# Patient Record
Sex: Male | Born: 1950 | Race: White | Hispanic: No | Marital: Married
Health system: Midwestern US, Academic
[De-identification: ages and names within clinical notes are randomized; demographics above are authoritative.]

## PROBLEM LIST (undated history)

## (undated) DIAGNOSIS — L97411 Non-pressure chronic ulcer of right heel and midfoot limited to breakdown of skin: ICD-10-CM

---

## 2017-05-07 ENCOUNTER — Encounter: Admit: 2017-05-07 | Discharge: 2017-05-07 | Payer: MEDICARE

## 2017-05-07 ENCOUNTER — Emergency Department: Admit: 2017-05-07 | Discharge: 2017-05-07 | Disposition: A | Discharge status: Home / Self Care | Payer: MEDICARE

## 2017-05-07 DIAGNOSIS — I10 Essential (primary) hypertension: Principal | ICD-10-CM

## 2017-05-07 DIAGNOSIS — R109 Unspecified abdominal pain: ICD-10-CM

## 2017-05-07 DIAGNOSIS — R110 Nausea: ICD-10-CM

## 2017-05-07 DIAGNOSIS — F17200 Nicotine dependence, unspecified, uncomplicated: ICD-10-CM

## 2017-05-07 DIAGNOSIS — E785 Hyperlipidemia, unspecified: ICD-10-CM

## 2017-05-07 DIAGNOSIS — D649 Anemia, unspecified: ICD-10-CM

## 2017-05-07 DIAGNOSIS — K921 Melena: ICD-10-CM

## 2017-05-07 DIAGNOSIS — R0600 Dyspnea, unspecified: ICD-10-CM

## 2017-05-07 DIAGNOSIS — E1165 Type 2 diabetes mellitus with hyperglycemia: Principal | ICD-10-CM

## 2017-05-07 DIAGNOSIS — K644 Residual hemorrhoidal skin tags: ICD-10-CM

## 2017-05-07 DIAGNOSIS — K859 Acute pancreatitis without necrosis or infection, unspecified: ICD-10-CM

## 2017-05-07 DIAGNOSIS — E119 Type 2 diabetes mellitus without complications: ICD-10-CM

## 2017-05-07 LAB — CBC AND DIFF
Lab: 0 % — ABNORMAL LOW (ref >60)
Lab: 0.4 10*3/uL (ref 0–0.80)
Lab: 0.8 10*3/uL — ABNORMAL LOW (ref 1.0–4.8)
Lab: 1 % (ref 0–5)
Lab: 113 10*3/uL — ABNORMAL LOW (ref 150–400)
Lab: 13 % — ABNORMAL LOW (ref 11–15)
Lab: 16 % — ABNORMAL LOW (ref 24–44)
Lab: 26 % — ABNORMAL LOW (ref 40–50)
Lab: 3.5 K/UL — ABNORMAL LOW (ref >60)
Lab: 30 pg — ABNORMAL LOW (ref 26–34)
Lab: 34 g/dL (ref 32.0–36.0)
Lab: 4.7 10*3/uL (ref 4.5–11.0)
Lab: 75 % (ref 41–77)
Lab: 8 % (ref 4–12)
Lab: 87 FL — ABNORMAL HIGH (ref 80–100)
Lab: 9.1 g/dL — ABNORMAL LOW (ref 13.5–16.5)
Lab: 9.4 FL (ref 7–11)

## 2017-05-07 LAB — POC BLOOD GAS VEN: Lab: 7.3 (ref 7.30–7.40)

## 2017-05-07 LAB — POC GLUCOSE
Lab: 389 mg/dL — ABNORMAL HIGH (ref 70–100)
Lab: 251 mg/dL — ABNORMAL HIGH (ref 70–100)

## 2017-05-07 LAB — BETA HYDROXYBUTYRATE (KETONES): Lab: 0.1 MMOL/L — ABNORMAL LOW (ref <0.3)

## 2017-05-07 LAB — COMPREHENSIVE METABOLIC PANEL: Lab: 127 MMOL/L — ABNORMAL LOW (ref 137–147)

## 2017-05-07 MED ORDER — SODIUM CHLORIDE 0.9 % IV SOLP
1000 mL | INTRAVENOUS | 0 refills | Status: CP
Start: 2017-05-07 — End: ?
  Administered 2017-05-07: 08:00:00 1000 mL via INTRAVENOUS

## 2017-05-07 NOTE — ED Notes
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

## 2017-06-07 ENCOUNTER — Ambulatory Visit: Admit: 2017-06-07 | Discharge: 2017-06-07 | Payer: MEDICARE

## 2017-06-07 ENCOUNTER — Encounter: Admit: 2017-06-07 | Discharge: 2017-06-07 | Payer: MEDICARE

## 2017-06-07 DIAGNOSIS — K861 Other chronic pancreatitis: Principal | ICD-10-CM

## 2017-06-07 DIAGNOSIS — R0600 Dyspnea, unspecified: ICD-10-CM

## 2017-06-07 DIAGNOSIS — D379 Neoplasm of uncertain behavior of digestive organ, unspecified: ICD-10-CM

## 2017-06-07 DIAGNOSIS — K859 Acute pancreatitis without necrosis or infection, unspecified: ICD-10-CM

## 2017-06-07 DIAGNOSIS — K863 Pseudocyst of pancreas: ICD-10-CM

## 2017-06-07 DIAGNOSIS — E785 Hyperlipidemia, unspecified: ICD-10-CM

## 2017-06-07 DIAGNOSIS — E119 Type 2 diabetes mellitus without complications: ICD-10-CM

## 2017-06-07 DIAGNOSIS — F17200 Nicotine dependence, unspecified, uncomplicated: ICD-10-CM

## 2017-06-07 DIAGNOSIS — I10 Essential (primary) hypertension: Principal | ICD-10-CM

## 2017-06-07 LAB — CBC AND DIFF
Lab: 0 10*3/uL (ref 0–0.20)
Lab: 1 % — ABNORMAL LOW (ref 0–2)
Lab: 19 % — ABNORMAL LOW (ref 24–44)
Lab: 2.5 M/UL — ABNORMAL LOW (ref 4.4–5.5)
Lab: 2.9 10*3/uL — ABNORMAL LOW (ref 4.5–11.0)

## 2017-06-07 LAB — COMPREHENSIVE METABOLIC PANEL
Lab: 0.2 mg/dL — ABNORMAL LOW (ref 0.3–1.2)
Lab: 1.9 mg/dL — ABNORMAL HIGH (ref 0.4–1.24)
Lab: 131 MMOL/L — ABNORMAL LOW (ref 137–147)
Lab: 16 U/L (ref 7–40)
Lab: 17 U/L (ref 7–56)
Lab: 35 mL/min — ABNORMAL LOW (ref >60)
Lab: 370 mg/dL — ABNORMAL HIGH (ref 70–100)
Lab: 4.2 MMOL/L — ABNORMAL LOW (ref 3.5–5.1)
Lab: 42 mL/min — ABNORMAL LOW (ref >60)
Lab: 5 10*3/uL — ABNORMAL LOW (ref 3–12)
Lab: 5.6 g/dL — ABNORMAL LOW (ref 6.0–8.0)
Lab: 50 U/L (ref 25–110)
Lab: 8.2 mg/dL — ABNORMAL LOW (ref 8.5–10.6)

## 2017-06-07 LAB — VITAMIN B12: Lab: 545 pg/mL (ref 180–914)

## 2017-06-07 LAB — CA19.9: Lab: 74 U/mL — ABNORMAL HIGH (ref <35)

## 2017-06-07 MED ORDER — PEG-ELECTROLYTE SOLN 420 GRAM PO SOLR
0 refills | Status: SS
Start: 2017-06-07 — End: 2017-06-25

## 2017-06-07 MED ORDER — SODIUM CHLORIDE 0.9 % IV SOLP: INTRAVENOUS | 0 refills | Status: CN

## 2017-06-20 ENCOUNTER — Encounter: Admit: 2017-06-20 | Discharge: 2017-06-20 | Payer: MEDICARE

## 2017-06-20 DIAGNOSIS — K861 Other chronic pancreatitis: Principal | ICD-10-CM

## 2017-06-20 DIAGNOSIS — R945 Abnormal results of liver function studies: ICD-10-CM

## 2017-06-21 ENCOUNTER — Encounter: Admit: 2017-06-21 | Discharge: 2017-06-21 | Payer: MEDICARE

## 2017-06-22 ENCOUNTER — Ambulatory Visit: Admit: 2017-06-22 | Discharge: 2017-06-22 | Payer: MEDICARE

## 2017-06-22 ENCOUNTER — Encounter: Admit: 2017-06-22 | Discharge: 2017-06-22 | Payer: MEDICARE

## 2017-06-22 DIAGNOSIS — R945 Abnormal results of liver function studies: ICD-10-CM

## 2017-06-22 DIAGNOSIS — K861 Other chronic pancreatitis: Principal | ICD-10-CM

## 2017-06-22 LAB — COMPREHENSIVE METABOLIC PANEL
Lab: 0.2 mg/dL — ABNORMAL LOW (ref 0.3–1.2)
Lab: 127 MMOL/L — ABNORMAL LOW (ref 137–147)
Lab: 15 U/L (ref 7–40)
Lab: 17 U/L (ref 7–56)
Lab: 2.1 mg/dL — ABNORMAL HIGH (ref 0.4–1.24)
Lab: 20 MMOL/L — ABNORMAL LOW (ref 21–30)
Lab: 3.4 g/dL — ABNORMAL LOW (ref 3.5–5.0)
Lab: 3.6 MMOL/L — ABNORMAL LOW (ref 3.5–5.1)
Lab: 31 mL/min — ABNORMAL LOW (ref >60)
Lab: 37 mL/min — ABNORMAL LOW (ref >60)
Lab: 6 g/dL (ref 6.0–8.0)
Lab: 63 mg/dL — ABNORMAL HIGH (ref 7–25)
Lab: 668 mg/dL — ABNORMAL HIGH (ref 70–100)
Lab: 8 (ref 3–12)
Lab: 8 mg/dL — ABNORMAL LOW (ref 8.5–10.6)
Lab: 80 U/L (ref 25–110)

## 2017-06-22 LAB — CBC
Lab: 2.8 M/UL — ABNORMAL LOW (ref 4.4–5.5)
Lab: 3 10*3/uL — ABNORMAL LOW (ref 4.5–11.0)

## 2017-06-22 LAB — PROTIME INR (PT): Lab: 1 MMOL/L (ref 0.8–1.2)

## 2017-06-25 ENCOUNTER — Ambulatory Visit: Admit: 2017-06-25 | Discharge: 2017-06-25 | Payer: MEDICARE

## 2017-06-25 ENCOUNTER — Encounter: Admit: 2017-06-25 | Discharge: 2017-06-25 | Payer: MEDICARE

## 2017-06-25 ENCOUNTER — Encounter: Admit: 2017-06-25 | Discharge: 2017-06-26 | Payer: MEDICARE

## 2017-06-25 DIAGNOSIS — R978 Other abnormal tumor markers: ICD-10-CM

## 2017-06-25 DIAGNOSIS — I10 Essential (primary) hypertension: ICD-10-CM

## 2017-06-25 DIAGNOSIS — K8689 Other specified diseases of pancreas: ICD-10-CM

## 2017-06-25 DIAGNOSIS — Z1211 Encounter for screening for malignant neoplasm of colon: Principal | ICD-10-CM

## 2017-06-25 DIAGNOSIS — E119 Type 2 diabetes mellitus without complications: ICD-10-CM

## 2017-06-25 DIAGNOSIS — K859 Acute pancreatitis without necrosis or infection, unspecified: Secondary | ICD-10-CM

## 2017-06-25 DIAGNOSIS — R933 Abnormal findings on diagnostic imaging of other parts of digestive tract: ICD-10-CM

## 2017-06-25 DIAGNOSIS — K644 Residual hemorrhoidal skin tags: ICD-10-CM

## 2017-06-25 DIAGNOSIS — K3189 Other diseases of stomach and duodenum: ICD-10-CM

## 2017-06-25 DIAGNOSIS — F17200 Nicotine dependence, unspecified, uncomplicated: ICD-10-CM

## 2017-06-25 DIAGNOSIS — K648 Other hemorrhoids: ICD-10-CM

## 2017-06-25 DIAGNOSIS — Z87891 Personal history of nicotine dependence: ICD-10-CM

## 2017-06-25 DIAGNOSIS — K861 Other chronic pancreatitis: ICD-10-CM

## 2017-06-25 DIAGNOSIS — R935 Abnormal findings on diagnostic imaging of other abdominal regions, including retroperitoneum: ICD-10-CM

## 2017-06-25 DIAGNOSIS — K269 Duodenal ulcer, unspecified as acute or chronic, without hemorrhage or perforation: ICD-10-CM

## 2017-06-25 DIAGNOSIS — R0600 Dyspnea, unspecified: ICD-10-CM

## 2017-06-25 DIAGNOSIS — E785 Hyperlipidemia, unspecified: ICD-10-CM

## 2017-06-25 LAB — POC BLOOD GAS VEN
Lab: 11 MMOL/L
Lab: 16 MMOL/L
Lab: 21 mmHg — ABNORMAL LOW (ref 33–48)
Lab: 29 % — ABNORMAL LOW (ref 55–71)
Lab: 36 mmHg (ref 36–50)
Lab: 7.2 — ABNORMAL LOW (ref 7.30–7.40)

## 2017-06-25 LAB — POC GLUCOSE
Lab: 228 mg/dL — ABNORMAL HIGH (ref 70–100)
Lab: 286 mg/dL — ABNORMAL HIGH (ref 70–100)
Lab: 274 mg/dL — ABNORMAL HIGH (ref 70–100)

## 2017-06-25 LAB — POC IONIZED CALCIUM: Lab: 1.2 MMOL/L (ref 1.0–1.3)

## 2017-06-25 LAB — POC SODIUM: Lab: 141 MMOL/L (ref 137–147)

## 2017-06-25 LAB — POC HEMATOCRIT
Lab: 24 % — ABNORMAL LOW (ref 40–50)
Lab: 8.2 g/dL — ABNORMAL LOW (ref 13.5–16.5)

## 2017-06-25 LAB — POC POTASSIUM: Lab: 3.5 MMOL/L (ref 3.5–5.1)

## 2017-06-25 MED ORDER — IOPAMIDOL 61 % IV SOLN
0 refills | Status: DC
Start: 2017-06-25 — End: 2017-06-25
  Administered 2017-06-25: 17:00:00 20 mL via INTRAMUSCULAR

## 2017-06-25 MED ORDER — ROCURONIUM 10 MG/ML IV SOLN
0 refills | Status: DC
Start: 2017-06-25 — End: 2017-06-25
  Administered 2017-06-25: 15:00:00 50 mg via INTRAVENOUS

## 2017-06-25 MED ORDER — EPHEDRINE SULFATE 50 MG/5ML SYR (10 MG/ML) (AN)(OSM)
0 refills | Status: DC
Start: 2017-06-25 — End: 2017-06-25
  Administered 2017-06-25 (×2): 10 mg via INTRAVENOUS

## 2017-06-25 MED ORDER — CEFTRIAXONE 1 GRAM IJ SOLR
0 refills | Status: DC
Start: 2017-06-25 — End: 2017-06-25
  Administered 2017-06-25: 15:00:00 1 g via INTRAVENOUS

## 2017-06-25 MED ORDER — INSULIN ASPART 100 UNIT/ML SC FLEXPEN
4 [IU] | Freq: Once | SUBCUTANEOUS | 0 refills | Status: CP
Start: 2017-06-25 — End: ?
  Administered 2017-06-25: 18:00:00 4 [IU] via SUBCUTANEOUS

## 2017-06-25 MED ORDER — FENTANYL CITRATE (PF) 50 MCG/ML IJ SOLN
0 refills | Status: DC
Start: 2017-06-25 — End: 2017-06-25
  Administered 2017-06-25 (×2): 50 ug via INTRAVENOUS
  Administered 2017-06-25: 14:00:00 100 ug via INTRAVENOUS

## 2017-06-25 MED ORDER — LIDOCAINE (PF) 200 MG/10 ML (2 %) IJ SYRG
0 refills | Status: DC
Start: 2017-06-25 — End: 2017-06-25
  Administered 2017-06-25: 14:00:00 100 mg via INTRAVENOUS

## 2017-06-25 MED ORDER — DEXAMETHASONE SODIUM PHOSPHATE 4 MG/ML IJ SOLN
INTRAVENOUS | 0 refills | Status: DC
Start: 2017-06-25 — End: 2017-06-25

## 2017-06-25 MED ORDER — DEXTRAN 70-HYPROMELLOSE (PF) 0.1-0.3 % OP DPET
0 refills | Status: DC
Start: 2017-06-25 — End: 2017-06-25
  Administered 2017-06-25: 14:00:00 2 [drp] via OPHTHALMIC

## 2017-06-25 MED ORDER — ONDANSETRON HCL (PF) 4 MG/2 ML IJ SOLN
INTRAVENOUS | 0 refills | Status: DC
Start: 2017-06-25 — End: 2017-06-25
  Administered 2017-06-25: 15:00:00 4 mg via INTRAVENOUS

## 2017-06-25 MED ORDER — KETAMINE 10 MG/ML IJ SOLN
0 refills | Status: DC
Start: 2017-06-25 — End: 2017-06-25
  Administered 2017-06-25: 15:00:00 30 mg via INTRAVENOUS

## 2017-06-25 MED ORDER — GLYCOPYRROLATE 0.2 MG/ML IJ SOLN
0 refills | Status: DC
Start: 2017-06-25 — End: 2017-06-25
  Administered 2017-06-25: 17:00:00 0.4 mg via INTRAVENOUS

## 2017-06-25 MED ORDER — SUCCINYLCHOLINE CHLORIDE 20 MG/ML IJ SOLN
INTRAVENOUS | 0 refills | Status: DC
Start: 2017-06-25 — End: 2017-06-25
  Administered 2017-06-25: 14:00:00 100 mg via INTRAVENOUS

## 2017-06-25 MED ORDER — PROPOFOL INJ 10 MG/ML IV VIAL
0 refills | Status: DC
Start: 2017-06-25 — End: 2017-06-25
  Administered 2017-06-25: 14:00:00 120 mg via INTRAVENOUS
  Administered 2017-06-25: 16:00:00 80 mg via INTRAVENOUS

## 2017-06-25 MED ORDER — NEOSTIGMINE METHYLSULFATE 1 MG/ML IJ SOLN
0 refills | Status: DC
Start: 2017-06-25 — End: 2017-06-25
  Administered 2017-06-25: 17:00:00 3 mg via INTRAVENOUS

## 2017-06-25 MED ORDER — SODIUM CHLORIDE 0.9 % IR SOLN
0 refills | Status: DC
Start: 2017-06-25 — End: 2017-06-25
  Administered 2017-06-25: 17:00:00 20 mL

## 2017-06-25 MED ORDER — LACTATED RINGERS IV SOLP
1000 mL | Freq: Once | INTRAVENOUS | 0 refills | Status: CP
Start: 2017-06-25 — End: ?
  Administered 2017-06-25: 13:00:00 1000 mL via INTRAVENOUS

## 2017-06-25 MED ORDER — SODIUM CHLORIDE 0.9 % IV SOLP
0 refills | Status: DC
Start: 2017-06-25 — End: 2017-06-25
  Administered 2017-06-25: 14:00:00 via INTRAVENOUS

## 2017-06-25 MED ORDER — PHENYLEPHRINE IN 0.9% NACL(PF) 1 MG/10 ML (100 MCG/ML) IV SYRG
INTRAVENOUS | 0 refills | Status: DC
Start: 2017-06-25 — End: 2017-06-25
  Administered 2017-06-25: 15:00:00 100 ug via INTRAVENOUS

## 2017-06-26 ENCOUNTER — Ambulatory Visit: Admit: 2017-06-26 | Discharge: 2017-06-26 | Payer: MEDICARE

## 2017-06-26 ENCOUNTER — Encounter: Admit: 2017-06-26 | Discharge: 2017-06-26 | Payer: MEDICARE

## 2017-06-26 DIAGNOSIS — K831 Obstruction of bile duct: Principal | ICD-10-CM

## 2017-06-26 MED ORDER — SODIUM CHLORIDE 0.9 % IV SOLP: INTRAVENOUS | 0 refills | Status: CN

## 2017-06-27 ENCOUNTER — Encounter: Admit: 2017-06-27 | Discharge: 2017-06-27 | Payer: MEDICARE

## 2017-06-27 DIAGNOSIS — I10 Essential (primary) hypertension: Principal | ICD-10-CM

## 2017-06-27 DIAGNOSIS — K859 Acute pancreatitis without necrosis or infection, unspecified: ICD-10-CM

## 2017-06-27 DIAGNOSIS — F17200 Nicotine dependence, unspecified, uncomplicated: ICD-10-CM

## 2017-06-27 DIAGNOSIS — E785 Hyperlipidemia, unspecified: ICD-10-CM

## 2017-06-27 DIAGNOSIS — E119 Type 2 diabetes mellitus without complications: ICD-10-CM

## 2017-06-27 DIAGNOSIS — R0600 Dyspnea, unspecified: ICD-10-CM

## 2017-06-28 ENCOUNTER — Encounter: Admit: 2017-06-28 | Discharge: 2017-06-28 | Payer: MEDICARE

## 2017-07-06 ENCOUNTER — Encounter: Admit: 2017-07-06 | Discharge: 2017-07-06 | Payer: MEDICARE

## 2017-08-09 ENCOUNTER — Encounter: Admit: 2017-08-09 | Discharge: 2017-08-09 | Payer: MEDICARE

## 2017-08-09 DIAGNOSIS — R0600 Dyspnea, unspecified: ICD-10-CM

## 2017-08-09 DIAGNOSIS — E119 Type 2 diabetes mellitus without complications: ICD-10-CM

## 2017-08-09 DIAGNOSIS — I10 Essential (primary) hypertension: Principal | ICD-10-CM

## 2017-08-09 DIAGNOSIS — F17200 Nicotine dependence, unspecified, uncomplicated: ICD-10-CM

## 2017-08-09 DIAGNOSIS — K859 Acute pancreatitis without necrosis or infection, unspecified: ICD-10-CM

## 2017-08-09 DIAGNOSIS — E785 Hyperlipidemia, unspecified: ICD-10-CM

## 2017-09-16 DIAGNOSIS — R739 Hyperglycemia, unspecified: ICD-10-CM

## 2017-09-16 MED ORDER — INSULIN REGULAR HUMAN(#) 1 UNIT/ML IJ SYRINGE
6 [IU] | Freq: Once | INTRAVENOUS | 0 refills | Status: CP
Start: 2017-09-16 — End: ?
  Administered 2017-09-17: 03:00:00 6 [IU] via INTRAVENOUS

## 2017-09-16 MED ORDER — VANCOMYCIN 1,250 MG IVPB
15 mg/kg | Freq: Once | INTRAVENOUS | 0 refills | Status: CP
Start: 2017-09-16 — End: ?
  Administered 2017-09-17 (×2): 1250 mg via INTRAVENOUS

## 2017-09-16 MED ORDER — CEFTRIAXONE INJ 1GM IVP
1 g | Freq: Once | INTRAVENOUS | 0 refills | Status: DC
Start: 2017-09-16 — End: 2017-09-17

## 2017-09-16 MED ORDER — INSULIN ASPART 100 UNIT/ML SC FLEXPEN
10 [IU] | Freq: Three times a day (TID) | SUBCUTANEOUS | 0 refills | Status: DC
Start: 2017-09-16 — End: 2017-09-17

## 2017-09-16 MED ORDER — PIPERACILLIN/TAZOBACTAM 3.375 G/NS IVPB (MB+)
3.375 g | Freq: Once | INTRAVENOUS | 0 refills | Status: CP
Start: 2017-09-16 — End: ?
  Administered 2017-09-17 (×2): 3.375 g via INTRAVENOUS

## 2017-09-16 MED ORDER — INSULIN GLARGINE 100 UNIT/ML (3 ML) SC INJ PEN
15 [IU] | Freq: Every evening | SUBCUTANEOUS | 0 refills | Status: DC
Start: 2017-09-16 — End: 2017-09-18
  Administered 2017-09-17: 06:00:00 15 [IU] via SUBCUTANEOUS

## 2017-09-16 MED ORDER — LACTATED RINGERS IV SOLP
1000 mL | INTRAVENOUS | 0 refills | Status: CP
Start: 2017-09-16 — End: ?
  Administered 2017-09-17: 03:00:00 1000 mL via INTRAVENOUS

## 2017-09-16 MED ORDER — NICARDIPINE IN NACL (ISO-OS) 20 MG/200 ML IV PGBK
5-15 mg/h | INTRAVENOUS | 0 refills | Status: DC
Start: 2017-09-16 — End: 2017-09-17
  Administered 2017-09-17: 05:00:00 5 mg/h via INTRAVENOUS

## 2017-09-17 ENCOUNTER — Inpatient Hospital Stay: Admit: 2017-09-17 | Discharge: 2017-09-20 | Disposition: A | Discharge status: Home Health | Payer: MEDICARE

## 2017-09-17 ENCOUNTER — Emergency Department: Admit: 2017-09-16 | Discharge: 2017-09-16 | Payer: MEDICARE

## 2017-09-17 ENCOUNTER — Inpatient Hospital Stay: Admit: 2017-09-17 | Discharge: 2017-09-17 | Payer: MEDICARE

## 2017-09-17 LAB — BETA HYDROXYBUTYRATE (KETONES): Lab: 0.1 MMOL/L (ref <0.3)

## 2017-09-17 LAB — COMPREHENSIVE METABOLIC PANEL
Lab: 0.2 mg/dL — ABNORMAL LOW (ref >60)
Lab: 129 MMOL/L — ABNORMAL LOW (ref 137–147)
Lab: 16 U/L (ref 7–40)
Lab: 19 U/L (ref 7–56)
Lab: 2.7 mg/dL — ABNORMAL HIGH (ref 0.4–1.24)
Lab: 23 MMOL/L (ref 21–30)
Lab: 23 mL/min — ABNORMAL LOW (ref >60)
Lab: 28 mL/min — ABNORMAL LOW (ref >60)
Lab: 52 mg/dL — ABNORMAL HIGH (ref 7–25)
Lab: 6 10*3/uL (ref 3–12)
Lab: 661 mg/dL — ABNORMAL HIGH (ref 70–100)
Lab: 79 U/L — ABNORMAL LOW (ref 25–110)
Lab: 8.3 mg/dL — ABNORMAL LOW (ref 8.5–10.6)

## 2017-09-17 LAB — HEMOGLOBIN A1C: Lab: 9.7 % — ABNORMAL HIGH (ref 4.0–6.0)

## 2017-09-17 LAB — POC GLUCOSE
Lab: 443 mg/dL — ABNORMAL HIGH (ref 70–100)
Lab: 468 mg/dL — ABNORMAL HIGH (ref 70–100)
Lab: 279 mg/dL — ABNORMAL HIGH (ref 70–100)
Lab: 101 mg/dL — ABNORMAL HIGH (ref 70–100)
Lab: 48 mg/dL — CL (ref 70–100)
Lab: 126 mg/dL — ABNORMAL HIGH (ref 70–100)
Lab: 53 mg/dL — ABNORMAL LOW (ref 70–100)
Lab: 73 mg/dL (ref 70–100)
Lab: 138 mg/dL — ABNORMAL HIGH (ref 70–100)

## 2017-09-17 LAB — SED RATE: Lab: 52 mm/h — ABNORMAL HIGH (ref 0–20)

## 2017-09-17 LAB — PHOSPHORUS: Lab: 3.7 mg/dL (ref 2.0–4.5)

## 2017-09-17 LAB — CBC AND DIFF
Lab: 0 10*3/uL (ref 0–0.20)
Lab: 0 10*3/uL (ref 0–0.45)
Lab: 4.3 K/UL — ABNORMAL LOW (ref 4.5–11.0)

## 2017-09-17 LAB — MAGNESIUM: Lab: 2 mg/dL (ref 1.6–2.6)

## 2017-09-17 LAB — TSH WITH FREE T4 REFLEX: Lab: 1.1 uU/mL (ref 0.35–5.00)

## 2017-09-17 LAB — C REACTIVE PROTEIN (CRP): Lab: 3.2 mg/dL — ABNORMAL HIGH (ref <1.0)

## 2017-09-17 MED ORDER — INSULIN ASPART 100 UNIT/ML SC FLEXPEN
15 [IU] | Freq: Once | SUBCUTANEOUS | 0 refills | Status: CP
Start: 2017-09-17 — End: ?
  Administered 2017-09-17: 06:00:00 15 [IU] via SUBCUTANEOUS

## 2017-09-17 MED ORDER — FENTANYL CITRATE (PF) 50 MCG/ML IJ SOLN
25-50 ug | INTRAVENOUS | 0 refills | Status: DC | PRN
Start: 2017-09-17 — End: 2017-09-17
  Administered 2017-09-17: 16:00:00 50 ug via INTRAVENOUS
  Administered 2017-09-17 (×2): 25 ug via INTRAVENOUS

## 2017-09-17 MED ORDER — FENOFIBRATE NANOCRYSTALLIZED 48 MG PO TAB
48 mg | Freq: Every day | ORAL | 0 refills | Status: DC
Start: 2017-09-17 — End: 2017-09-20
  Administered 2017-09-17 – 2017-09-20 (×4): 48 mg via ORAL

## 2017-09-17 MED ORDER — PIPERACILLIN/TAZOBACTAM 3.375 G/NS IVPB (MB+)
3.375 g | INTRAVENOUS | 0 refills | Status: DC
Start: 2017-09-17 — End: 2017-09-17
  Administered 2017-09-17 (×4): 3.375 g via INTRAVENOUS

## 2017-09-17 MED ORDER — PANTOPRAZOLE 40 MG PO TBEC
40 mg | Freq: Two times a day (BID) | ORAL | 0 refills | Status: DC
Start: 2017-09-17 — End: 2017-09-20
  Administered 2017-09-17 – 2017-09-20 (×7): 40 mg via ORAL

## 2017-09-17 MED ORDER — INSULIN ASPART 100 UNIT/ML SC FLEXPEN
0-6 [IU] | Freq: Before meals | SUBCUTANEOUS | 0 refills | Status: DC
Start: 2017-09-17 — End: 2017-09-20

## 2017-09-17 MED ORDER — AMLODIPINE 10 MG PO TAB
10 mg | Freq: Every day | ORAL | 0 refills | Status: DC
Start: 2017-09-17 — End: 2017-09-20
  Administered 2017-09-17 – 2017-09-20 (×4): 10 mg via ORAL

## 2017-09-17 MED ORDER — ASPIRIN 81 MG PO TBEC
81 mg | Freq: Every day | ORAL | 0 refills | Status: DC
Start: 2017-09-17 — End: 2017-09-20
  Administered 2017-09-17 – 2017-09-20 (×4): 81 mg via ORAL

## 2017-09-17 MED ORDER — LOSARTAN 50 MG PO TAB
100 mg | Freq: Every day | ORAL | 0 refills | Status: DC
Start: 2017-09-17 — End: 2017-09-18
  Administered 2017-09-17: 14:00:00 100 mg via ORAL

## 2017-09-17 MED ORDER — VANCOMYCIN RANDOM DOSING
1 | INTRAVENOUS | 0 refills | Status: DC
Start: 2017-09-17 — End: 2017-09-18

## 2017-09-17 MED ORDER — VANCOMYCIN 1G/250ML D5W IVPB (VIAL2BAG)
1 g | Freq: Once | INTRAVENOUS | 0 refills | Status: CP
Start: 2017-09-17 — End: ?
  Administered 2017-09-18 (×2): 1000 mg via INTRAVENOUS

## 2017-09-17 MED ORDER — CITALOPRAM 20 MG PO TAB
40 mg | Freq: Every day | ORAL | 0 refills | Status: DC
Start: 2017-09-17 — End: 2017-09-20
  Administered 2017-09-17 – 2017-09-20 (×4): 40 mg via ORAL

## 2017-09-17 MED ORDER — HEPARIN, PORCINE (PF) 5,000 UNIT/0.5 ML IJ SYRG
5000 [IU] | SUBCUTANEOUS | 0 refills | Status: DC
Start: 2017-09-17 — End: 2017-09-20
  Administered 2017-09-17 – 2017-09-20 (×10): 5000 [IU] via SUBCUTANEOUS

## 2017-09-17 MED ORDER — HYDROCODONE-ACETAMINOPHEN 5-325 MG PO TAB
1 | ORAL | 0 refills | Status: DC | PRN
Start: 2017-09-17 — End: 2017-09-19
  Administered 2017-09-17 – 2017-09-19 (×10): 1 via ORAL

## 2017-09-17 MED ORDER — INSULIN ASPART 100 UNIT/ML SC FLEXPEN
6 [IU] | Freq: Three times a day (TID) | SUBCUTANEOUS | 0 refills | Status: DC
Start: 2017-09-17 — End: 2017-09-20

## 2017-09-17 MED ORDER — METOPROLOL SUCCINATE 50 MG PO TB24
100 mg | Freq: Every day | ORAL | 0 refills | Status: DC
Start: 2017-09-17 — End: 2017-09-18
  Administered 2017-09-17 – 2017-09-18 (×2): 100 mg via ORAL

## 2017-09-17 MED ORDER — SENNOSIDES-DOCUSATE SODIUM 8.6-50 MG PO TAB
1 | Freq: Two times a day (BID) | ORAL | 0 refills | Status: DC
Start: 2017-09-17 — End: 2017-09-20
  Administered 2017-09-17 – 2017-09-20 (×7): 1 via ORAL

## 2017-09-17 MED ORDER — INSULIN ASPART 100 UNIT/ML SC FLEXPEN
0-12 [IU] | Freq: Before meals | SUBCUTANEOUS | 0 refills | Status: DC
Start: 2017-09-17 — End: 2017-09-17

## 2017-09-17 MED ORDER — FERROUS SULFATE 325 MG (65 MG IRON) PO TAB
325 mg | Freq: Two times a day (BID) | ORAL | 0 refills | Status: DC
Start: 2017-09-17 — End: 2017-09-20
  Administered 2017-09-17 – 2017-09-20 (×8): 325 mg via ORAL

## 2017-09-17 MED ORDER — SODIUM CHLORIDE 0.9 % IV SOLP
INTRAVENOUS | 0 refills | Status: DC
Start: 2017-09-17 — End: 2017-09-17
  Administered 2017-09-17: 06:00:00 1000.000 mL via INTRAVENOUS

## 2017-09-17 MED ORDER — ROPINIROLE 1 MG PO TAB
4 mg | Freq: Every evening | ORAL | 0 refills | Status: DC
Start: 2017-09-17 — End: 2017-09-20
  Administered 2017-09-17 – 2017-09-20 (×4): 4 mg via ORAL

## 2017-09-17 MED ORDER — VANCOMYCIN PHARMACY TO MANAGE
1 | 0 refills | Status: DC
Start: 2017-09-17 — End: 2017-09-18

## 2017-09-18 LAB — POC GLUCOSE
Lab: 116 mg/dL — ABNORMAL HIGH (ref 70–100)
Lab: 54 mg/dL — ABNORMAL LOW (ref 70–100)
Lab: 71 mg/dL (ref 70–100)
Lab: 148 mg/dL — ABNORMAL HIGH (ref 70–100)
Lab: 149 mg/dL — ABNORMAL HIGH (ref 70–100)

## 2017-09-18 LAB — MAGNESIUM: Lab: 2.1 mg/dL — ABNORMAL HIGH (ref >60)

## 2017-09-18 LAB — PHOSPHORUS: Lab: 4.5 mg/dL — ABNORMAL HIGH (ref 2.0–4.5)

## 2017-09-18 LAB — SED RATE: Lab: 27 mm/h — ABNORMAL HIGH (ref 0–20)

## 2017-09-18 LAB — CBC AND DIFF
Lab: 2.5 M/UL — ABNORMAL LOW (ref 4.4–5.5)
Lab: 2.8 K/UL — ABNORMAL LOW (ref 4.5–11.0)

## 2017-09-18 LAB — VANCOMYCIN TIMED LEVEL: Lab: 9.5 ug/mL

## 2017-09-18 LAB — COMPREHENSIVE METABOLIC PANEL
Lab: 137 MMOL/L — ABNORMAL LOW (ref >60)
Lab: 3.5 MMOL/L — ABNORMAL LOW (ref >60)
Lab: 81 mg/dL (ref 70–100)

## 2017-09-18 LAB — C REACTIVE PROTEIN (CRP): Lab: 2.2 mg/dL — ABNORMAL HIGH (ref <1.0)

## 2017-09-18 MED ORDER — CLINDAMYCIN HCL 150 MG PO CAP
300 mg | ORAL | 0 refills | Status: DC
Start: 2017-09-18 — End: 2017-09-20
  Administered 2017-09-19 – 2017-09-20 (×7): 300 mg via ORAL

## 2017-09-18 MED ORDER — MORPHINE(#) 0.01%/SILVER SULFADIAZINE 1% TOPICAL CRM
TOPICAL | 0 refills | Status: DC
Start: 2017-09-18 — End: 2017-09-20
  Administered 2017-09-19: 01:00:00 via TOPICAL

## 2017-09-18 MED ORDER — INSULIN GLARGINE 100 UNIT/ML (3 ML) SC INJ PEN
10 [IU] | Freq: Every evening | SUBCUTANEOUS | 0 refills | Status: DC
Start: 2017-09-18 — End: 2017-09-19

## 2017-09-18 MED ORDER — CARVEDILOL 12.5 MG PO TAB
12.5 mg | Freq: Two times a day (BID) | ORAL | 0 refills | Status: DC
Start: 2017-09-18 — End: 2017-09-20
  Administered 2017-09-19 – 2017-09-20 (×3): 12.5 mg via ORAL

## 2017-09-19 ENCOUNTER — Inpatient Hospital Stay: Admit: 2017-09-19 | Discharge: 2017-09-19 | Payer: MEDICARE

## 2017-09-19 ENCOUNTER — Encounter: Admit: 2017-09-19 | Discharge: 2017-09-19 | Payer: MEDICARE

## 2017-09-19 LAB — POC GLUCOSE
Lab: 162 mg/dL — ABNORMAL HIGH (ref 70–100)
Lab: 177 mg/dL — ABNORMAL HIGH (ref 70–100)
Lab: 197 mg/dL — ABNORMAL HIGH (ref 70–100)
Lab: 228 mg/dL — ABNORMAL HIGH (ref 70–100)

## 2017-09-19 LAB — MAGNESIUM: Lab: 2.1 mg/dL — ABNORMAL LOW (ref 1.6–2.6)

## 2017-09-19 LAB — CBC AND DIFF: Lab: 2.7 K/UL — ABNORMAL LOW (ref >60)

## 2017-09-19 LAB — PHOSPHORUS: Lab: 4.7 mg/dL — ABNORMAL HIGH (ref 2.0–4.5)

## 2017-09-19 LAB — COMPREHENSIVE METABOLIC PANEL: Lab: 138 MMOL/L — ABNORMAL LOW (ref >60)

## 2017-09-19 MED ORDER — CARVEDILOL 12.5 MG PO TAB
12.5 mg | ORAL_TABLET | Freq: Two times a day (BID) | ORAL | 1 refills | Status: CN
Start: 2017-09-19 — End: ?

## 2017-09-19 MED ORDER — FENTANYL CITRATE (PF) 50 MCG/ML IJ SOLN
25 ug | Freq: Once | INTRAVENOUS | 0 refills | Status: CP
Start: 2017-09-19 — End: ?
  Administered 2017-09-20: 05:00:00 25 ug via INTRAVENOUS

## 2017-09-19 MED ORDER — ACETAMINOPHEN 325 MG PO TAB
650 mg | ORAL | 0 refills | Status: DC
Start: 2017-09-19 — End: 2017-09-20
  Administered 2017-09-19 – 2017-09-20 (×5): 650 mg via ORAL

## 2017-09-19 MED ORDER — OXYCODONE 10 MG PO TAB
10 mg | ORAL | 0 refills | Status: DC | PRN
Start: 2017-09-19 — End: 2017-09-20
  Administered 2017-09-19 – 2017-09-20 (×4): 10 mg via ORAL

## 2017-09-19 MED ORDER — INSULIN GLARGINE 100 UNIT/ML (3 ML) SC INJ PEN
12 [IU] | Freq: Every evening | SUBCUTANEOUS | 0 refills | Status: DC
Start: 2017-09-19 — End: 2017-09-20

## 2017-09-19 MED ORDER — EPOETIN ALFA  5,000 UNIT IJ SOLN
5000 [IU] | Freq: Once | SUBCUTANEOUS | 0 refills | Status: CP
Start: 2017-09-19 — End: ?
  Administered 2017-09-19 (×2): 5000 [IU] via SUBCUTANEOUS

## 2017-09-20 ENCOUNTER — Encounter: Admit: 2017-09-20 | Discharge: 2017-09-20 | Payer: MEDICARE

## 2017-09-20 DIAGNOSIS — Z87891 Personal history of nicotine dependence: ICD-10-CM

## 2017-09-20 DIAGNOSIS — D61818 Other pancytopenia: ICD-10-CM

## 2017-09-20 DIAGNOSIS — N183 Chronic kidney disease, stage 3 (moderate): ICD-10-CM

## 2017-09-20 DIAGNOSIS — Z8711 Personal history of peptic ulcer disease: ICD-10-CM

## 2017-09-20 DIAGNOSIS — I160 Hypertensive urgency: ICD-10-CM

## 2017-09-20 DIAGNOSIS — E1122 Type 2 diabetes mellitus with diabetic chronic kidney disease: ICD-10-CM

## 2017-09-20 DIAGNOSIS — E785 Hyperlipidemia, unspecified: ICD-10-CM

## 2017-09-20 DIAGNOSIS — E1151 Type 2 diabetes mellitus with diabetic peripheral angiopathy without gangrene: ICD-10-CM

## 2017-09-20 DIAGNOSIS — L97429 Non-pressure chronic ulcer of left heel and midfoot with unspecified severity: ICD-10-CM

## 2017-09-20 DIAGNOSIS — E11621 Type 2 diabetes mellitus with foot ulcer: Principal | ICD-10-CM

## 2017-09-20 DIAGNOSIS — I129 Hypertensive chronic kidney disease with stage 1 through stage 4 chronic kidney disease, or unspecified chronic kidney disease: ICD-10-CM

## 2017-09-20 DIAGNOSIS — R700 Elevated erythrocyte sedimentation rate: ICD-10-CM

## 2017-09-20 DIAGNOSIS — E1100 Type 2 diabetes mellitus with hyperosmolarity without nonketotic hyperglycemic-hyperosmolar coma (NKHHC): ICD-10-CM

## 2017-09-20 DIAGNOSIS — Z794 Long term (current) use of insulin: ICD-10-CM

## 2017-09-20 DIAGNOSIS — I2510 Atherosclerotic heart disease of native coronary artery without angina pectoris: ICD-10-CM

## 2017-09-20 DIAGNOSIS — E871 Hypo-osmolality and hyponatremia: ICD-10-CM

## 2017-09-20 DIAGNOSIS — E1142 Type 2 diabetes mellitus with diabetic polyneuropathy: ICD-10-CM

## 2017-09-20 DIAGNOSIS — E11649 Type 2 diabetes mellitus with hypoglycemia without coma: ICD-10-CM

## 2017-09-20 LAB — POC GLUCOSE
Lab: 180 mg/dL — ABNORMAL HIGH (ref 70–100)
Lab: 310 mg/dL — ABNORMAL HIGH (ref 70–100)
Lab: 105 mg/dL — ABNORMAL HIGH (ref 70–100)

## 2017-09-20 LAB — MAGNESIUM: Lab: 2.1 mg/dL — ABNORMAL HIGH (ref >60)

## 2017-09-20 LAB — COMPREHENSIVE METABOLIC PANEL: Lab: 138 MMOL/L — ABNORMAL LOW (ref >60)

## 2017-09-20 LAB — PHOSPHORUS: Lab: 5.1 mg/dL — ABNORMAL HIGH (ref >60)

## 2017-09-20 LAB — CBC AND DIFF: Lab: 2.4 K/UL — ABNORMAL LOW (ref 4.5–11.0)

## 2017-09-20 MED ORDER — CLINDAMYCIN HCL 300 MG PO CAP
300 mg | ORAL_CAPSULE | ORAL | 0 refills | Status: AC
Start: 2017-09-20 — End: ?

## 2017-09-20 MED ORDER — LOSARTAN 100 MG PO TAB
100 mg | ORAL_TABLET | Freq: Every morning | ORAL | 3 refills | 30.00000 days | Status: AC
Start: 2017-09-20 — End: 2017-09-20

## 2017-09-20 MED ORDER — OXYCODONE 10 MG PO TAB
10 mg | ORAL_TABLET | ORAL | 0 refills | 6.00000 days | Status: AC | PRN
Start: 2017-09-20 — End: 2017-09-26

## 2017-09-20 MED ORDER — LOSARTAN 100 MG PO TAB
100 mg | Freq: Every morning | ORAL | 0 refills | 30.00000 days | Status: AC
Start: 2017-09-20 — End: 2017-09-26

## 2017-09-20 MED ORDER — MORPHINE(#) 0.01%/SILVER SULFADIAZINE 1% TOPICAL CRM
400 g | TOPICAL | 0 refills | 10.00000 days | Status: AC
Start: 2017-09-20 — End: 2017-11-28

## 2017-09-20 MED ORDER — CARVEDILOL 25 MG PO TAB
25 mg | ORAL_TABLET | Freq: Two times a day (BID) | ORAL | 1 refills | 90.00000 days | Status: AC
Start: 2017-09-20 — End: 2018-01-30

## 2017-09-20 MED ORDER — CARVEDILOL 12.5 MG PO TAB
25 mg | Freq: Two times a day (BID) | ORAL | 0 refills | Status: DC
Start: 2017-09-20 — End: 2017-09-20

## 2017-09-26 ENCOUNTER — Encounter: Admit: 2017-09-26 | Discharge: 2017-09-26 | Payer: MEDICARE

## 2017-09-26 ENCOUNTER — Ambulatory Visit: Admit: 2017-09-26 | Discharge: 2017-09-26 | Payer: MEDICARE

## 2017-09-26 DIAGNOSIS — Z09 Encounter for follow-up examination after completed treatment for conditions other than malignant neoplasm: ICD-10-CM

## 2017-09-26 DIAGNOSIS — I10 Essential (primary) hypertension: ICD-10-CM

## 2017-09-26 DIAGNOSIS — E119 Type 2 diabetes mellitus without complications: ICD-10-CM

## 2017-09-26 DIAGNOSIS — F17200 Nicotine dependence, unspecified, uncomplicated: ICD-10-CM

## 2017-09-26 DIAGNOSIS — M79672 Pain in left foot: ICD-10-CM

## 2017-09-26 DIAGNOSIS — E11628 Type 2 diabetes mellitus with other skin complications: Principal | ICD-10-CM

## 2017-09-26 DIAGNOSIS — Z23 Encounter for immunization: ICD-10-CM

## 2017-09-26 DIAGNOSIS — K859 Acute pancreatitis without necrosis or infection, unspecified: ICD-10-CM

## 2017-09-26 DIAGNOSIS — E1122 Type 2 diabetes mellitus with diabetic chronic kidney disease: ICD-10-CM

## 2017-09-26 DIAGNOSIS — R0600 Dyspnea, unspecified: ICD-10-CM

## 2017-09-26 DIAGNOSIS — E785 Hyperlipidemia, unspecified: ICD-10-CM

## 2017-09-26 MED ORDER — OXYCODONE 10 MG PO TAB
10 mg | ORAL_TABLET | ORAL | 0 refills | 6.00000 days | Status: AC | PRN
Start: 2017-09-26 — End: 2017-10-22

## 2017-10-04 ENCOUNTER — Emergency Department: Admit: 2017-10-04 | Discharge: 2017-10-05 | Disposition: A | Discharge status: Home / Self Care | Payer: MEDICARE

## 2017-10-04 DIAGNOSIS — L97529 Non-pressure chronic ulcer of other part of left foot with unspecified severity: ICD-10-CM

## 2017-10-04 LAB — CBC AND DIFF
Lab: 0 10*3/uL (ref 0–0.20)
Lab: 0 10*3/uL (ref 0–0.45)
Lab: 5.1 10*3/uL (ref 4.5–11.0)

## 2017-10-04 LAB — COMPREHENSIVE METABOLIC PANEL
Lab: 0.2 mg/dL — ABNORMAL LOW (ref 0.3–1.2)
Lab: 103 MMOL/L — ABNORMAL LOW (ref 98–110)
Lab: 130 MMOL/L — ABNORMAL LOW (ref 137–147)
Lab: 211 mg/dL — ABNORMAL HIGH (ref 70–100)
Lab: 25 mL/min — ABNORMAL LOW (ref >60)
Lab: 3 10*3/uL (ref 3–12)
Lab: 30 mL/min — ABNORMAL LOW (ref >60)
Lab: 4.4 MMOL/L — ABNORMAL LOW (ref 3.5–5.1)
Lab: 51 mg/dL — ABNORMAL HIGH (ref 7–25)
Lab: 8 mg/dL — ABNORMAL LOW (ref 8.5–10.6)

## 2017-10-04 LAB — SED RATE: Lab: 68 mm/h — ABNORMAL HIGH (ref 0–20)

## 2017-10-04 LAB — C REACTIVE PROTEIN (CRP): Lab: 1.8 mg/dL — ABNORMAL HIGH (ref <1.0)

## 2017-10-04 MED ORDER — CARVEDILOL 25 MG PO TAB
25 mg | Freq: Once | ORAL | 0 refills | Status: CP
Start: 2017-10-04 — End: ?
  Administered 2017-10-04: 23:00:00 25 mg via ORAL

## 2017-10-04 MED ORDER — OXYCODONE 10 MG PO TAB
10 mg | Freq: Once | ORAL | 0 refills | Status: CP
Start: 2017-10-04 — End: ?
  Administered 2017-10-05: 02:00:00 10 mg via ORAL

## 2017-10-04 MED ORDER — AMLODIPINE 5 MG PO TAB
10 mg | Freq: Once | ORAL | 0 refills | Status: CP
Start: 2017-10-04 — End: ?
  Administered 2017-10-04: 23:00:00 10 mg via ORAL

## 2017-10-04 MED ORDER — OXYCODONE 10 MG PO TAB
10 mg | ORAL_TABLET | ORAL | 0 refills | 6.00000 days | Status: AC | PRN
Start: 2017-10-04 — End: 2017-10-22

## 2017-10-05 ENCOUNTER — Encounter: Admit: 2017-10-05 | Discharge: 2017-10-05 | Payer: MEDICARE

## 2017-10-05 ENCOUNTER — Emergency Department: Admit: 2017-10-04 | Discharge: 2017-10-04 | Payer: MEDICARE

## 2017-10-05 DIAGNOSIS — E11622 Type 2 diabetes mellitus with other skin ulcer: Principal | ICD-10-CM

## 2017-10-05 DIAGNOSIS — I10 Essential (primary) hypertension: ICD-10-CM

## 2017-10-05 DIAGNOSIS — L97429 Non-pressure chronic ulcer of left heel and midfoot with unspecified severity: ICD-10-CM

## 2017-10-05 DIAGNOSIS — E1165 Type 2 diabetes mellitus with hyperglycemia: ICD-10-CM

## 2017-10-05 DIAGNOSIS — Z794 Long term (current) use of insulin: ICD-10-CM

## 2017-10-09 ENCOUNTER — Encounter: Admit: 2017-10-09 | Discharge: 2017-10-09 | Payer: MEDICARE

## 2017-10-22 ENCOUNTER — Encounter: Admit: 2017-10-22 | Discharge: 2017-10-22 | Payer: MEDICARE

## 2017-10-22 ENCOUNTER — Ambulatory Visit: Admit: 2017-10-22 | Discharge: 2017-10-22 | Payer: MEDICARE

## 2017-10-22 ENCOUNTER — Inpatient Hospital Stay
Admit: 2017-10-22 | Discharge: 2017-11-28 | Disposition: A | Discharge status: Skilled Nursing Facility | Payer: MEDICARE | Attending: Student in an Organized Health Care Education/Training Program | Admitting: Vascular & Interventional Radiology

## 2017-10-22 DIAGNOSIS — I748 Embolism and thrombosis of other arteries: Secondary | ICD-10-CM

## 2017-10-22 DIAGNOSIS — I10 Essential (primary) hypertension: Principal | ICD-10-CM

## 2017-10-22 DIAGNOSIS — K8689 Other specified diseases of pancreas: ICD-10-CM

## 2017-10-22 DIAGNOSIS — I70209 Unspecified atherosclerosis of native arteries of extremities, unspecified extremity: ICD-10-CM

## 2017-10-22 DIAGNOSIS — K859 Acute pancreatitis without necrosis or infection, unspecified: ICD-10-CM

## 2017-10-22 DIAGNOSIS — R0600 Dyspnea, unspecified: ICD-10-CM

## 2017-10-22 DIAGNOSIS — E119 Type 2 diabetes mellitus without complications: ICD-10-CM

## 2017-10-22 DIAGNOSIS — F17200 Nicotine dependence, unspecified, uncomplicated: ICD-10-CM

## 2017-10-22 DIAGNOSIS — E785 Hyperlipidemia, unspecified: ICD-10-CM

## 2017-10-22 DIAGNOSIS — R801 Persistent proteinuria, unspecified: ICD-10-CM

## 2017-10-22 LAB — COMPREHENSIVE METABOLIC PANEL
Lab: 0.3 mg/dL (ref 0.3–1.2)
Lab: 107 MMOL/L (ref 98–110)
Lab: 11 U/L (ref 7–56)
Lab: 13 U/L (ref 7–40)
Lab: 135 MMOL/L — ABNORMAL LOW (ref 137–147)
Lab: 17 MMOL/L — ABNORMAL LOW (ref 21–30)
Lab: 2.4 mg/dL — ABNORMAL HIGH (ref 0.4–1.24)
Lab: 211 mg/dL — ABNORMAL HIGH (ref 70–100)
Lab: 26 mL/min — ABNORMAL LOW (ref >60)
Lab: 3 g/dL — ABNORMAL LOW (ref 3.5–5.0)
Lab: 32 mL/min — ABNORMAL LOW (ref >60)
Lab: 4.3 MMOL/L — ABNORMAL LOW (ref 3.5–5.1)
Lab: 59 mg/dL — ABNORMAL HIGH (ref 7–25)
Lab: 6 g/dL (ref 6.0–8.0)
Lab: 76 U/L (ref 25–110)
Lab: 8.6 mg/dL — ABNORMAL LOW (ref 8.5–10.6)
Lab: 11 (ref 3–12)

## 2017-10-22 LAB — RETICULOCYTE COUNT
Lab: 1 %
Lab: 2.1 % — ABNORMAL HIGH (ref 0.5–2.0)
Lab: 54 10*3/uL (ref 30–94)

## 2017-10-22 LAB — POC GLUCOSE: Lab: 216 mg/dL — ABNORMAL HIGH (ref 70–100)

## 2017-10-22 LAB — CBC: Lab: 4.8 10*3/uL (ref 4.5–11.0)

## 2017-10-22 LAB — PROTIME INR (PT): Lab: 1.1 M/UL — ABNORMAL LOW (ref 0.8–1.2)

## 2017-10-22 MED ORDER — INSULIN GLARGINE 100 UNIT/ML (3 ML) SC INJ PEN
20 [IU] | Freq: Every evening | SUBCUTANEOUS | 0 refills | Status: DC
Start: 2017-10-22 — End: 2017-10-23
  Administered 2017-10-23: 02:00:00 20 [IU] via SUBCUTANEOUS

## 2017-10-22 MED ORDER — CARVEDILOL 25 MG PO TAB
25 mg | Freq: Two times a day (BID) | ORAL | 0 refills | Status: DC
Start: 2017-10-22 — End: 2017-11-13
  Administered 2017-10-23 – 2017-11-12 (×40): 25 mg via ORAL

## 2017-10-22 MED ORDER — INSULIN ASPART 100 UNIT/ML SC FLEXPEN
0-6 [IU] | Freq: Before meals | SUBCUTANEOUS | 0 refills | Status: DC
Start: 2017-10-22 — End: 2017-11-17
  Administered 2017-10-30 – 2017-11-10 (×2): 2 [IU] via SUBCUTANEOUS

## 2017-10-22 MED ORDER — ONDANSETRON HCL (PF) 4 MG/2 ML IJ SOLN
4 mg | INTRAVENOUS | 0 refills | Status: DC | PRN
Start: 2017-10-22 — End: 2017-10-24

## 2017-10-22 MED ORDER — FENTANYL CITRATE (PF) 50 MCG/ML IJ SOLN
25-50 ug | INTRAVENOUS | 0 refills | Status: DC | PRN
Start: 2017-10-22 — End: 2017-10-27
  Administered 2017-10-22 – 2017-10-23 (×3): 25 ug via INTRAVENOUS
  Administered 2017-10-23 – 2017-10-24 (×3): 50 ug via INTRAVENOUS
  Administered 2017-10-24 (×2): 25 ug via INTRAVENOUS
  Administered 2017-10-25 – 2017-10-26 (×7): 50 ug via INTRAVENOUS
  Administered 2017-10-27 (×4): 25 ug via INTRAVENOUS

## 2017-10-22 MED ORDER — AMLODIPINE 10 MG PO TAB
10 mg | Freq: Every morning | ORAL | 0 refills | Status: DC
Start: 2017-10-22 — End: 2017-11-13
  Administered 2017-10-23 – 2017-11-12 (×20): 10 mg via ORAL

## 2017-10-22 MED ORDER — BISACODYL 10 MG RE SUPP
10 mg | Freq: Every day | RECTAL | 0 refills | Status: DC | PRN
Start: 2017-10-22 — End: 2017-11-28

## 2017-10-22 MED ORDER — FUROSEMIDE 40 MG PO TAB
40 mg | Freq: Every morning | ORAL | 0 refills | Status: DC
Start: 2017-10-22 — End: 2017-10-22

## 2017-10-22 MED ORDER — SODIUM CHLORIDE 0.9 % IV SOLP
INTRAVENOUS | 0 refills | Status: DC
Start: 2017-10-22 — End: 2017-10-24
  Administered 2017-10-22 – 2017-10-23 (×2): 1000.000 mL via INTRAVENOUS

## 2017-10-22 MED ORDER — OXYCODONE 5 MG PO TAB
5-10 mg | ORAL | 0 refills | Status: DC | PRN
Start: 2017-10-22 — End: 2017-11-02
  Administered 2017-10-23 (×3): 10 mg via ORAL
  Administered 2017-10-23: 5 mg via ORAL
  Administered 2017-10-24 – 2017-10-27 (×10): 10 mg via ORAL
  Administered 2017-10-27 (×2): 5 mg via ORAL
  Administered 2017-10-27 (×2): 10 mg via ORAL
  Administered 2017-10-27: 03:00:00 5 mg via ORAL
  Administered 2017-10-28 – 2017-10-30 (×9): 10 mg via ORAL
  Administered 2017-10-30: 04:00:00 5 mg via ORAL
  Administered 2017-10-30: 16:00:00 10 mg via ORAL
  Administered 2017-10-30 (×2): 5 mg via ORAL
  Administered 2017-10-31 (×2): 10 mg via ORAL
  Administered 2017-11-01 (×2): 5 mg via ORAL
  Administered 2017-11-01 (×4): 10 mg via ORAL
  Administered 2017-11-02 (×2): 5 mg via ORAL
  Administered 2017-11-02 (×2): 10 mg via ORAL

## 2017-10-22 MED ORDER — INSULIN ASPART 100 UNIT/ML SC FLEXPEN
20 [IU] | Freq: Before meals | SUBCUTANEOUS | 0 refills | Status: DC
Start: 2017-10-22 — End: 2017-10-22

## 2017-10-22 MED ORDER — ROPINIROLE 2 MG PO TAB
4 mg | Freq: Every evening | ORAL | 0 refills | Status: DC
Start: 2017-10-22 — End: 2017-11-13
  Administered 2017-10-23 – 2017-11-13 (×22): 4 mg via ORAL

## 2017-10-22 MED ORDER — MELATONIN 3 MG PO TAB
3 mg | Freq: Every evening | ORAL | 0 refills | Status: DC | PRN
Start: 2017-10-22 — End: 2017-11-13
  Administered 2017-10-23 – 2017-11-11 (×7): 3 mg via ORAL

## 2017-10-22 MED ORDER — MORPHINE(#) 0.01%/SILVER SULFADIAZINE 1% TOPICAL CRM
400 g | TOPICAL | 0 refills | Status: DC
Start: 2017-10-22 — End: 2017-11-11
  Administered 2017-10-23 – 2017-10-28 (×3): 400 g via TOPICAL

## 2017-10-22 MED ORDER — MULTIVIT-IRON-FA-CALCIUM-MINS 9 MG IRON-400 MCG PO TAB
1 | Freq: Every day | ORAL | 0 refills | Status: DC
Start: 2017-10-22 — End: 2017-11-28
  Administered 2017-10-23 – 2017-11-28 (×34): 1 via ORAL

## 2017-10-22 MED ORDER — NITROGLYCERIN 0.4 MG SL SUBL
.4 mg | SUBLINGUAL | 0 refills | Status: DC | PRN
Start: 2017-10-22 — End: 2017-10-24

## 2017-10-22 MED ORDER — FENOFIBRATE NANOCRYSTALLIZED 145 MG PO TAB
145 mg | Freq: Every day | ORAL | 0 refills | Status: DC
Start: 2017-10-22 — End: 2017-10-22

## 2017-10-22 MED ORDER — ONDANSETRON HCL 4 MG PO TAB
4 mg | ORAL | 0 refills | Status: DC | PRN
Start: 2017-10-22 — End: 2017-10-24

## 2017-10-22 MED ORDER — INSULIN ASPART 100 UNIT/ML SC FLEXPEN
5 [IU] | Freq: Three times a day (TID) | SUBCUTANEOUS | 0 refills | Status: DC
Start: 2017-10-22 — End: 2017-10-23
  Administered 2017-10-23: 5 [IU] via SUBCUTANEOUS

## 2017-10-22 MED ORDER — ASPIRIN 81 MG PO TBEC
81 mg | Freq: Every day | ORAL | 0 refills | Status: DC
Start: 2017-10-22 — End: 2017-11-03
  Administered 2017-10-23 – 2017-11-03 (×12): 81 mg via ORAL

## 2017-10-22 MED ORDER — PANTOPRAZOLE 40 MG PO TBEC
40 mg | Freq: Two times a day (BID) | ORAL | 0 refills | Status: DC
Start: 2017-10-22 — End: 2017-11-13
  Administered 2017-10-23 – 2017-11-13 (×41): 40 mg via ORAL

## 2017-10-22 MED ORDER — CITALOPRAM 20 MG PO TAB
40 mg | Freq: Every day | ORAL | 0 refills | Status: DC
Start: 2017-10-22 — End: 2017-11-08
  Administered 2017-10-23 – 2017-11-06 (×15): 40 mg via ORAL

## 2017-10-22 MED ORDER — FERROUS SULFATE 325 MG (65 MG IRON) PO TAB
325 mg | Freq: Two times a day (BID) | ORAL | 0 refills | Status: DC
Start: 2017-10-22 — End: 2017-11-13
  Administered 2017-10-23 – 2017-11-13 (×41): 325 mg via ORAL

## 2017-10-22 MED ORDER — INSULIN GLARGINE 100 UNIT/ML (3 ML) SC INJ PEN
15 [IU] | Freq: Every evening | SUBCUTANEOUS | 0 refills | Status: DC
Start: 2017-10-22 — End: 2017-10-22

## 2017-10-23 LAB — PARATHYROID HORMONE: Lab: 69 pg/mL — ABNORMAL HIGH (ref 10–65)

## 2017-10-23 LAB — CBC
Lab: 15 % — ABNORMAL HIGH (ref 11–15)
Lab: 18 % — ABNORMAL LOW (ref 40–50)
Lab: 2.1 M/UL — ABNORMAL LOW (ref 4.4–5.5)
Lab: 29 pg (ref 26–34)
Lab: 3 10*3/uL — ABNORMAL LOW (ref 4.5–11.0)
Lab: 34 g/dL (ref 32.0–36.0)
Lab: 6.3 g/dL — ABNORMAL LOW (ref 13.5–16.5)
Lab: 81 10*3/uL — ABNORMAL LOW (ref 150–400)
Lab: 85 FL (ref 80–100)
Lab: 9.2 FL (ref 7–11)
Lab: 14 % (ref 11–15)
Lab: 2.3 M/UL — ABNORMAL LOW (ref 4.4–5.5)
Lab: 20 % — ABNORMAL LOW (ref 40–50)
Lab: 28 pg (ref 26–34)
Lab: 3 10*3/uL — ABNORMAL LOW (ref 4.5–11.0)
Lab: 33 g/dL (ref 32.0–36.0)
Lab: 6.8 g/dL — ABNORMAL LOW (ref 13.5–16.5)
Lab: 8.9 FL (ref 7–11)
Lab: 82 10*3/uL — ABNORMAL LOW (ref 150–400)
Lab: 85 FL (ref 80–100)

## 2017-10-23 LAB — POC GLUCOSE
Lab: 166 mg/dL — ABNORMAL HIGH (ref 70–100)
Lab: 181 mg/dL — ABNORMAL HIGH (ref 70–100)
Lab: 202 mg/dL — ABNORMAL HIGH (ref 70–100)
Lab: 221 mg/dL — ABNORMAL HIGH (ref 70–100)
Lab: 241 mg/dL — ABNORMAL HIGH (ref 70–100)
Lab: 58 mg/dL — ABNORMAL LOW (ref 70–100)
Lab: 67 mg/dL — ABNORMAL LOW (ref 70–100)
Lab: 82 mg/dL (ref 70–100)

## 2017-10-23 LAB — BASIC METABOLIC PANEL: Lab: 136 MMOL/L — ABNORMAL LOW (ref >60)

## 2017-10-23 LAB — PERIPHERAL SMEAR

## 2017-10-23 MED ORDER — INSULIN ASPART 100 UNIT/ML SC FLEXPEN
8 [IU] | Freq: Three times a day (TID) | SUBCUTANEOUS | 0 refills | Status: DC
Start: 2017-10-23 — End: 2017-10-29

## 2017-10-23 MED ORDER — INSULIN GLARGINE 100 UNIT/ML (3 ML) SC INJ PEN
28 [IU] | Freq: Every evening | SUBCUTANEOUS | 0 refills | Status: DC
Start: 2017-10-23 — End: 2017-10-26

## 2017-10-24 ENCOUNTER — Encounter: Admit: 2017-10-24 | Discharge: 2017-10-24 | Payer: MEDICARE

## 2017-10-24 DIAGNOSIS — L97521 Non-pressure chronic ulcer of other part of left foot limited to breakdown of skin: Secondary | ICD-10-CM

## 2017-10-24 LAB — PROTEIN/CR RATIO,UR RAN
Lab: 4.6
Lab: 405 mg/dL
Lab: 89 mg/dL

## 2017-10-24 LAB — POC GLUCOSE
Lab: 281 mg/dL — ABNORMAL HIGH (ref 70–100)
Lab: 179 mg/dL — ABNORMAL HIGH (ref 70–100)
Lab: 131 mg/dL — ABNORMAL HIGH (ref 70–100)
Lab: 143 mg/dL — ABNORMAL HIGH (ref 70–100)

## 2017-10-24 LAB — BASIC METABOLIC PANEL
Lab: 115 MMOL/L — ABNORMAL HIGH (ref 98–110)
Lab: 138 MMOL/L — ABNORMAL HIGH (ref 137–147)
Lab: 7 K/UL — ABNORMAL LOW (ref 3–12)

## 2017-10-24 MED ORDER — LACTATED RINGERS IV SOLP
INTRAVENOUS | 0 refills | Status: AC
Start: 2017-10-24 — End: ?
  Administered 2017-10-24 – 2017-10-26 (×3): 1000.000 mL via INTRAVENOUS

## 2017-10-25 ENCOUNTER — Encounter: Admit: 2017-10-25 | Discharge: 2017-10-25 | Payer: MEDICARE

## 2017-10-25 DIAGNOSIS — F17200 Nicotine dependence, unspecified, uncomplicated: ICD-10-CM

## 2017-10-25 DIAGNOSIS — E785 Hyperlipidemia, unspecified: ICD-10-CM

## 2017-10-25 DIAGNOSIS — R0600 Dyspnea, unspecified: ICD-10-CM

## 2017-10-25 DIAGNOSIS — I10 Essential (primary) hypertension: Principal | ICD-10-CM

## 2017-10-25 DIAGNOSIS — E119 Type 2 diabetes mellitus without complications: ICD-10-CM

## 2017-10-25 DIAGNOSIS — K859 Acute pancreatitis without necrosis or infection, unspecified: ICD-10-CM

## 2017-10-25 LAB — POC GLUCOSE
Lab: 103 mg/dL — ABNORMAL HIGH (ref 70–100)
Lab: 104 mg/dL — ABNORMAL HIGH (ref 70–100)
Lab: 118 mg/dL — ABNORMAL HIGH (ref 70–100)
Lab: 118 mg/dL — ABNORMAL HIGH (ref 70–100)
Lab: 133 mg/dL — ABNORMAL HIGH (ref 70–100)
Lab: 54 mg/dL — ABNORMAL LOW (ref 70–100)
Lab: 57 mg/dL — ABNORMAL LOW (ref 70–100)
Lab: 80 mg/dL (ref 70–100)
Lab: 85 mg/dL (ref 70–100)
Lab: 87 mg/dL (ref 70–100)
Lab: 99 mg/dL (ref 70–100)

## 2017-10-25 LAB — CBC: Lab: 3.2 K/UL — ABNORMAL LOW (ref >60)

## 2017-10-25 LAB — BASIC METABOLIC PANEL: Lab: 136 MMOL/L — ABNORMAL LOW (ref >60)

## 2017-10-25 MED ORDER — HYDROMORPHONE (PF) 2 MG/ML IJ SYRG
1 mg | INTRAVENOUS | 0 refills | Status: DC | PRN
Start: 2017-10-25 — End: 2017-10-26

## 2017-10-25 MED ORDER — PROMETHAZINE 25 MG/ML IJ SOLN
6.25 mg | INTRAVENOUS | 0 refills | Status: DC | PRN
Start: 2017-10-25 — End: 2017-10-26

## 2017-10-25 MED ORDER — PHENYLEPHRINE IN 0.9% NACL(PF) 1 MG/10 ML (100 MCG/ML) IV SYRG
INTRAVENOUS | 0 refills | Status: DC
Start: 2017-10-25 — End: 2017-10-25
  Administered 2017-10-25: 20:00:00 50 ug via INTRAVENOUS
  Administered 2017-10-25: 20:00:00 100 ug via INTRAVENOUS

## 2017-10-25 MED ORDER — INSULIN GLARGINE 100 UNIT/ML (3 ML) SC INJ PEN
20 [IU] | Freq: Every evening | SUBCUTANEOUS | 0 refills | Status: DC
Start: 2017-10-25 — End: 2017-10-26

## 2017-10-25 MED ORDER — PHENYLEPHRINE IV DRIP (STD CONC)
0 refills | Status: DC
Start: 2017-10-25 — End: 2017-10-25
  Administered 2017-10-25 (×2): .5 ug/kg/min via INTRAVENOUS

## 2017-10-25 MED ORDER — HALOPERIDOL LACTATE 5 MG/ML IJ SOLN
1 mg | Freq: Once | INTRAVENOUS | 0 refills | Status: DC | PRN
Start: 2017-10-25 — End: 2017-10-26

## 2017-10-25 MED ORDER — IOPAMIDOL 61 % IV SOLN
110 mL | Freq: Once | INTRA_ARTERIAL | 0 refills | Status: CP
Start: 2017-10-25 — End: ?
  Administered 2017-10-25: 23:00:00 110 mL via INTRA_ARTERIAL

## 2017-10-25 MED ORDER — EPHEDRINE SULFATE 50 MG/ML IJ SOLN
0 refills | Status: DC
Start: 2017-10-25 — End: 2017-10-25
  Administered 2017-10-25 (×3): 10 mg via INTRAVENOUS

## 2017-10-25 MED ORDER — FENTANYL CITRATE (PF) 50 MCG/ML IJ SOLN
0 refills | Status: DC
Start: 2017-10-25 — End: 2017-10-25
  Administered 2017-10-25 (×2): 25 ug via INTRAVENOUS
  Administered 2017-10-25: 19:00:00 50 ug via INTRAVENOUS

## 2017-10-25 MED ORDER — DEXTRAN 70-HYPROMELLOSE (PF) 0.1-0.3 % OP DPET
0 refills | Status: DC
Start: 2017-10-25 — End: 2017-10-25
  Administered 2017-10-25: 19:00:00 2 [drp] via OPHTHALMIC

## 2017-10-25 MED ORDER — OXYCODONE 5 MG PO TAB
5-10 mg | Freq: Once | ORAL | 0 refills | Status: DC | PRN
Start: 2017-10-25 — End: 2017-10-26

## 2017-10-25 MED ORDER — SUCCINYLCHOLINE CHLORIDE 20 MG/ML IJ SOLN
INTRAVENOUS | 0 refills | Status: DC
Start: 2017-10-25 — End: 2017-10-25
  Administered 2017-10-25: 19:00:00 140 mg via INTRAVENOUS

## 2017-10-25 MED ORDER — PROPOFOL INJ 10 MG/ML IV VIAL
0 refills | Status: DC
Start: 2017-10-25 — End: 2017-10-25
  Administered 2017-10-25: 19:00:00 50 mg via INTRAVENOUS
  Administered 2017-10-25 (×2): 30 mg via INTRAVENOUS

## 2017-10-25 MED ORDER — NITROGLYCERIN(#) INJ 100MCG/ML IJ SOLN
0 refills | Status: CP
Start: 2017-10-25 — End: ?
  Administered 2017-10-25 (×2): 200 ug via INTRA_ARTERIAL

## 2017-10-25 MED ORDER — HEPARIN 2000 UNITS/ NITROGLYCERIN 6000 MCG/ VERAPAMIL 10 MG 1L IN NS
Freq: Once | INTRA_ARTERIAL | 0 refills | Status: AC
Start: 2017-10-25 — End: ?

## 2017-10-25 MED ORDER — DEXTROSE 50 % IN WATER (D50W) IV SOLP
0 refills | Status: DC
Start: 2017-10-25 — End: 2017-10-25
  Administered 2017-10-25 (×2): 25 mL via INTRAVENOUS

## 2017-10-25 MED ORDER — FENTANYL CITRATE (PF) 50 MCG/ML IJ SOLN
50 ug | INTRAVENOUS | 0 refills | Status: DC | PRN
Start: 2017-10-25 — End: 2017-10-26

## 2017-10-25 MED ORDER — HEPARIN (PORCINE) 1,000 UNIT/ML IJ SOLN
0 refills | Status: DC
Start: 2017-10-25 — End: 2017-10-25
  Administered 2017-10-25: 20:00:00 7000 [IU] via INTRAVENOUS

## 2017-10-25 MED ORDER — ONDANSETRON HCL (PF) 4 MG/2 ML IJ SOLN
INTRAVENOUS | 0 refills | Status: DC
Start: 2017-10-25 — End: 2017-10-25
  Administered 2017-10-25: 22:00:00 4 mg via INTRAVENOUS

## 2017-10-25 MED ORDER — DIPHENHYDRAMINE HCL 50 MG/ML IJ SOLN
25 mg | Freq: Once | INTRAVENOUS | 0 refills | Status: DC | PRN
Start: 2017-10-25 — End: 2017-10-26

## 2017-10-25 MED ORDER — INSULIN GLARGINE 100 UNIT/ML (3 ML) SC INJ PEN
20 [IU] | Freq: Every evening | SUBCUTANEOUS | 0 refills | Status: DC
Start: 2017-10-25 — End: 2017-10-29
  Administered 2017-10-27: 04:00:00 20 [IU] via SUBCUTANEOUS

## 2017-10-25 MED ORDER — LIDOCAINE (PF) 200 MG/10 ML (2 %) IJ SYRG
0 refills | Status: DC
Start: 2017-10-25 — End: 2017-10-25
  Administered 2017-10-25: 19:00:00 80 mg via INTRAVENOUS

## 2017-10-25 MED ADMIN — DEXTROSE 50 % IN WATER (D50W) IV SYRG [2365]: 25 mL | INTRAVENOUS | @ 18:00:00 | Stop: 2017-10-25 | NDC 00409751716

## 2017-10-26 ENCOUNTER — Encounter: Admit: 2017-10-26 | Discharge: 2017-10-26 | Payer: MEDICARE

## 2017-10-26 DIAGNOSIS — L97521 Non-pressure chronic ulcer of other part of left foot limited to breakdown of skin: Secondary | ICD-10-CM

## 2017-10-26 DIAGNOSIS — E785 Hyperlipidemia, unspecified: ICD-10-CM

## 2017-10-26 DIAGNOSIS — R0600 Dyspnea, unspecified: ICD-10-CM

## 2017-10-26 DIAGNOSIS — F17200 Nicotine dependence, unspecified, uncomplicated: ICD-10-CM

## 2017-10-26 DIAGNOSIS — K859 Acute pancreatitis without necrosis or infection, unspecified: ICD-10-CM

## 2017-10-26 DIAGNOSIS — I10 Essential (primary) hypertension: Principal | ICD-10-CM

## 2017-10-26 DIAGNOSIS — E119 Type 2 diabetes mellitus without complications: ICD-10-CM

## 2017-10-26 LAB — LACTIC ACID (BG - RAPID LACTATE): Lab: 1 MMOL/L (ref 0.5–2.0)

## 2017-10-26 LAB — BASIC METABOLIC PANEL: Lab: 133 MMOL/L — ABNORMAL LOW (ref 137–147)

## 2017-10-26 LAB — POC GLUCOSE
Lab: 100 mg/dL — ABNORMAL LOW (ref 70–100)
Lab: 124 mg/dL — ABNORMAL HIGH (ref 70–100)
Lab: 131 mg/dL — ABNORMAL HIGH (ref 70–100)
Lab: 177 mg/dL — ABNORMAL HIGH (ref 70–100)
Lab: 188 mg/dL — ABNORMAL HIGH (ref 70–100)
Lab: 68 mg/dL — ABNORMAL LOW (ref 70–100)
Lab: 69 mg/dL — ABNORMAL LOW (ref 70–100)
Lab: 72 mg/dL (ref 70–100)
Lab: 90 mg/dL (ref 70–100)
Lab: 91 mg/dL (ref 70–100)

## 2017-10-26 LAB — CREATINE KINASE-CPK: Lab: 219 U/L (ref 35–232)

## 2017-10-26 LAB — CBC: Lab: 3.6 K/UL — ABNORMAL LOW (ref >60)

## 2017-10-26 LAB — ALBUMIN: Lab: 2.3 g/dL — ABNORMAL LOW (ref 3.5–5.0)

## 2017-10-26 MED ORDER — DEXTRAN 70-HYPROMELLOSE (PF) 0.1-0.3 % OP DPET
0 refills | Status: DC
Start: 2017-10-26 — End: 2017-10-26
  Administered 2017-10-26: 22:00:00 2 [drp] via OPHTHALMIC

## 2017-10-26 MED ORDER — FENTANYL CITRATE (PF) 50 MCG/ML IJ SOLN
50 ug | INTRAVENOUS | 0 refills | Status: DC | PRN
Start: 2017-10-26 — End: 2017-10-27

## 2017-10-26 MED ORDER — NEOSTIGMINE METHYLSULFATE 1 MG/ML IJ SOLN
0 refills | Status: DC
Start: 2017-10-26 — End: 2017-10-26
  Administered 2017-10-26: 23:00:00 5 mg via INTRAVENOUS

## 2017-10-26 MED ORDER — ELECTROLYTE-A IV SOLP
0 refills | Status: DC
Start: 2017-10-26 — End: 2017-10-26
  Administered 2017-10-26: 21:00:00 via INTRAVENOUS

## 2017-10-26 MED ORDER — ONDANSETRON HCL (PF) 4 MG/2 ML IJ SOLN
INTRAVENOUS | 0 refills | Status: DC
Start: 2017-10-26 — End: 2017-10-26
  Administered 2017-10-26: 23:00:00 4 mg via INTRAVENOUS

## 2017-10-26 MED ORDER — FENTANYL CITRATE (PF) 50 MCG/ML IJ SOLN
0 refills | Status: DC
Start: 2017-10-26 — End: 2017-10-26
  Administered 2017-10-26: 21:00:00 50 ug via INTRAVENOUS
  Administered 2017-10-26: 23:00:00 25 ug via INTRAVENOUS
  Administered 2017-10-26: 23:00:00 50 ug via INTRAVENOUS

## 2017-10-26 MED ORDER — HEPARIN 5,000 UNITS IN LR 500 ML IRR
0 refills | Status: DC
Start: 2017-10-26 — End: 2017-10-26
  Administered 2017-10-26 (×2): 500 mL

## 2017-10-26 MED ORDER — HEPARIN (PORCINE) BOLUS FOR CONTINUOUS INF (BAG)
80 [IU]/kg | Freq: Once | INTRAVENOUS | 0 refills | Status: CP
Start: 2017-10-26 — End: ?

## 2017-10-26 MED ORDER — GLYCOPYRROLATE 0.2 MG/ML IJ SOLN
0 refills | Status: DC
Start: 2017-10-26 — End: 2017-10-26
  Administered 2017-10-26: 23:00:00 0.8 mg via INTRAVENOUS

## 2017-10-26 MED ORDER — FENTANYL CITRATE (PF) 50 MCG/ML IJ SOLN
25 ug | INTRAVENOUS | 0 refills | Status: DC | PRN
Start: 2017-10-26 — End: 2017-10-27
  Administered 2017-10-27: 25 ug via INTRAVENOUS

## 2017-10-26 MED ORDER — HEPARIN (PORCINE) IN 5 % DEX 20,000 UNIT/500 ML (40 UNIT/ML) IV SOLP
0-2000 [IU]/h | INTRAVENOUS | 0 refills | Status: DC
Start: 2017-10-26 — End: 2017-10-26
  Administered 2017-10-26: 20:00:00 1413 [IU]/h via INTRAVENOUS

## 2017-10-26 MED ORDER — LACTATED RINGERS IV SOLP
0 refills | Status: DC
Start: 2017-10-26 — End: 2017-10-26
  Administered 2017-10-26: 21:00:00 via INTRAVENOUS

## 2017-10-26 MED ORDER — LIDOCAINE (PF) 10 MG/ML (1 %) IJ SOLN
.1-2 mL | INTRAMUSCULAR | 0 refills | Status: DC | PRN
Start: 2017-10-26 — End: 2017-10-27

## 2017-10-26 MED ORDER — ALBUMIN, HUMAN 5 % 500 ML IV SOLP (AN)(OSM)
0 refills | Status: DC
Start: 2017-10-26 — End: 2017-10-26
  Administered 2017-10-26: 22:00:00 via INTRAVENOUS

## 2017-10-26 MED ORDER — PROMETHAZINE 25 MG/ML IJ SOLN
6.25 mg | INTRAVENOUS | 0 refills | Status: DC | PRN
Start: 2017-10-26 — End: 2017-10-27

## 2017-10-26 MED ORDER — PHENYLEPHRINE IN 0.9% NACL(PF) 1 MG/10 ML (100 MCG/ML) IV SYRG
INTRAVENOUS | 0 refills | Status: DC
Start: 2017-10-26 — End: 2017-10-26
  Administered 2017-10-26 (×2): 100 ug via INTRAVENOUS

## 2017-10-26 MED ORDER — CEFAZOLIN 1 GRAM IJ SOLR
0 refills | Status: DC
Start: 2017-10-26 — End: 2017-10-26
  Administered 2017-10-26: 22:00:00 2 g via INTRAVENOUS

## 2017-10-26 MED ORDER — LACTATED RINGERS IV SOLP
1000 mL | INTRAVENOUS | 0 refills | Status: DC
Start: 2017-10-26 — End: 2017-10-27
  Administered 2017-10-26: 21:00:00 1000 mL via INTRAVENOUS

## 2017-10-26 MED ORDER — DEXAMETHASONE SODIUM PHOSPHATE 4 MG/ML IJ SOLN
INTRAVENOUS | 0 refills | Status: DC
Start: 2017-10-26 — End: 2017-10-26
  Administered 2017-10-26: 22:00:00 4 mg via INTRAVENOUS

## 2017-10-26 MED ORDER — DIPHENHYDRAMINE HCL 50 MG/ML IJ SOLN
25 mg | Freq: Once | INTRAVENOUS | 0 refills | Status: DC | PRN
Start: 2017-10-26 — End: 2017-10-27

## 2017-10-26 MED ORDER — ROCURONIUM 10 MG/ML IV SOLN
INTRAVENOUS | 0 refills | Status: DC
Start: 2017-10-26 — End: 2017-10-26
  Administered 2017-10-26 (×2): 20 mg via INTRAVENOUS

## 2017-10-26 MED ORDER — HEPARIN (PORCINE) 1,000 UNIT/ML IJ SOLN
0 refills | Status: DC
Start: 2017-10-26 — End: 2017-10-26
  Administered 2017-10-26: 22:00:00 8000 [IU] via INTRAVENOUS

## 2017-10-26 MED ORDER — MIDAZOLAM 1 MG/ML IJ SOLN
INTRAVENOUS | 0 refills | Status: DC
Start: 2017-10-26 — End: 2017-10-26
  Administered 2017-10-26: 21:00:00 2 mg via INTRAVENOUS

## 2017-10-26 MED ORDER — SODIUM BICARBONATE 650 MG PO TAB
650 mg | Freq: Two times a day (BID) | ORAL | 0 refills | Status: DC
Start: 2017-10-26 — End: 2017-11-14
  Administered 2017-10-26 – 2017-11-14 (×37): 650 mg via ORAL

## 2017-10-26 MED ORDER — CEFAZOLIN 1GM NS IRR
0 refills | Status: DC
Start: 2017-10-26 — End: 2017-10-26
  Administered 2017-10-26 (×2): 1000 mL

## 2017-10-26 MED ORDER — HEPARIN (PORCINE) BOLUS FOR CONTINUOUS INF (BAG)
20-40 [IU]/kg | INTRAVENOUS | 0 refills | Status: DC
Start: 2017-10-26 — End: 2017-10-26

## 2017-10-26 MED ORDER — HEPARIN 5,000 UNITS IN LR 500 ML IRR
Freq: Once | 0 refills | Status: DC
Start: 2017-10-26 — End: 2017-10-26

## 2017-10-26 MED ORDER — PHENYLEPHRINE IV DRIP (STD CONC)
0 refills | Status: DC
Start: 2017-10-26 — End: 2017-10-26
  Administered 2017-10-26 (×2): .3 ug/kg/min via INTRAVENOUS

## 2017-10-26 MED ORDER — LIDOCAINE (PF) 200 MG/10 ML (2 %) IJ SYRG
0 refills | Status: DC
Start: 2017-10-26 — End: 2017-10-26
  Administered 2017-10-26: 21:00:00 80 mg via INTRAVENOUS

## 2017-10-26 MED ORDER — HEPARIN (PORCINE) IN 5 % DEX 20,000 UNIT/500 ML (40 UNIT/ML) IV SOLP
500 [IU]/h | INTRAVENOUS | 0 refills | Status: DC
Start: 2017-10-26 — End: 2017-10-30
  Administered 2017-10-27 – 2017-10-29 (×2): 500 [IU]/h via INTRAVENOUS

## 2017-10-26 MED ORDER — PROPOFOL INJ 10 MG/ML IV VIAL
0 refills | Status: DC
Start: 2017-10-26 — End: 2017-10-26
  Administered 2017-10-26: 21:00:00 80 mg via INTRAVENOUS

## 2017-10-26 MED ORDER — SUCCINYLCHOLINE CHLORIDE 20 MG/ML IJ SOLN
INTRAVENOUS | 0 refills | Status: DC
Start: 2017-10-26 — End: 2017-10-26
  Administered 2017-10-26: 21:00:00 80 mg via INTRAVENOUS

## 2017-10-26 MED ORDER — METOCLOPRAMIDE HCL 5 MG/ML IJ SOLN
10 mg | Freq: Once | INTRAVENOUS | 0 refills | Status: DC | PRN
Start: 2017-10-26 — End: 2017-10-27

## 2017-10-27 LAB — BASIC METABOLIC PANEL
Lab: 134 MMOL/L — ABNORMAL LOW (ref >60)
Lab: 128 mg/dL — ABNORMAL HIGH (ref 70–100)
Lab: 130 MMOL/L — ABNORMAL LOW (ref 137–147)
Lab: 15 MMOL/L — ABNORMAL LOW (ref 21–30)
Lab: 17 mL/min — ABNORMAL LOW (ref >60)
Lab: 20 mL/min — ABNORMAL LOW (ref >60)
Lab: 3.6 mg/dL — ABNORMAL HIGH (ref 0.4–1.24)
Lab: 5.1 MMOL/L (ref 3.5–5.1)
Lab: 66 mg/dL — ABNORMAL HIGH (ref 7–25)
Lab: 7.5 mg/dL — ABNORMAL LOW (ref 8.5–10.6)

## 2017-10-27 LAB — POC GLUCOSE
Lab: 138 mg/dL — ABNORMAL HIGH (ref 70–100)
Lab: 137 mg/dL — ABNORMAL HIGH (ref 70–100)
Lab: 124 mg/dL — ABNORMAL HIGH (ref 70–100)
Lab: 136 mg/dL — ABNORMAL HIGH (ref 70–100)

## 2017-10-27 LAB — CBC: Lab: 4.9 K/UL — ABNORMAL LOW (ref >60)

## 2017-10-27 LAB — CREATINE KINASE-CPK: Lab: 130 U/L (ref 35–232)

## 2017-10-27 LAB — IONIZED CALCIUM: Lab: 1 MMOL/L (ref 1.0–1.3)

## 2017-10-27 LAB — PTT (APTT): Lab: 29 s — ABNORMAL HIGH (ref 24.0–36.5)

## 2017-10-27 MED ORDER — SODIUM CHLORIDE 0.9 % IV SOLP
500 mL | INTRAVENOUS | 0 refills | Status: AC
Start: 2017-10-27 — End: ?
  Administered 2017-10-27: 15:00:00 500 mL via INTRAVENOUS

## 2017-10-27 MED ORDER — ATORVASTATIN 40 MG PO TAB
40 mg | Freq: Every day | ORAL | 0 refills | Status: DC
Start: 2017-10-27 — End: 2017-11-28
  Administered 2017-10-27 – 2017-11-28 (×29): 40 mg via ORAL

## 2017-10-27 MED ORDER — FENTANYL CITRATE (PF) 50 MCG/ML IJ SOLN
25-50 ug | INTRAVENOUS | 0 refills | Status: DC | PRN
Start: 2017-10-27 — End: 2017-10-30
  Administered 2017-10-27 – 2017-10-29 (×9): 25 ug via INTRAVENOUS
  Administered 2017-10-29 – 2017-10-30 (×2): 50 ug via INTRAVENOUS
  Administered 2017-10-30: 17:00:00 25 ug via INTRAVENOUS
  Administered 2017-10-30: 19:00:00 50 ug via INTRAVENOUS

## 2017-10-27 MED ORDER — CALCIUM GLUCONATE 1GM/100ML NS MB+
1 g | Freq: Once | INTRAVENOUS | 0 refills | Status: CP
Start: 2017-10-27 — End: ?
  Administered 2017-10-27 (×2): 1 g via INTRAVENOUS

## 2017-10-27 MED ORDER — ACETAMINOPHEN 325 MG PO TAB
650 mg | ORAL | 0 refills | Status: DC
Start: 2017-10-27 — End: 2017-10-30
  Administered 2017-10-27 – 2017-10-30 (×10): 650 mg via ORAL

## 2017-10-28 ENCOUNTER — Encounter: Admit: 2017-10-28 | Discharge: 2017-10-28 | Payer: MEDICARE

## 2017-10-28 DIAGNOSIS — E785 Hyperlipidemia, unspecified: ICD-10-CM

## 2017-10-28 DIAGNOSIS — I10 Essential (primary) hypertension: Principal | ICD-10-CM

## 2017-10-28 DIAGNOSIS — K859 Acute pancreatitis without necrosis or infection, unspecified: ICD-10-CM

## 2017-10-28 DIAGNOSIS — R0600 Dyspnea, unspecified: ICD-10-CM

## 2017-10-28 DIAGNOSIS — E119 Type 2 diabetes mellitus without complications: ICD-10-CM

## 2017-10-28 DIAGNOSIS — F17200 Nicotine dependence, unspecified, uncomplicated: ICD-10-CM

## 2017-10-28 LAB — BASIC METABOLIC PANEL: Lab: 132 MMOL/L — ABNORMAL LOW (ref 137–147)

## 2017-10-28 LAB — POC GLUCOSE
Lab: 106 mg/dL — ABNORMAL HIGH (ref 70–100)
Lab: 72 mg/dL (ref 70–100)
Lab: 85 mg/dL (ref 70–100)
Lab: 94 mg/dL (ref 70–100)

## 2017-10-28 LAB — CBC: Lab: 3.6 K/UL — ABNORMAL LOW (ref >60)

## 2017-10-28 MED ORDER — SENNOSIDES 8.6 MG PO TAB
2 | Freq: Two times a day (BID) | ORAL | 0 refills | Status: DC
Start: 2017-10-28 — End: 2017-11-28
  Administered 2017-10-28 – 2017-11-28 (×48): 2 via ORAL

## 2017-10-28 MED ORDER — POLYETHYLENE GLYCOL 3350 17 GRAM PO PWPK
1 | Freq: Every day | ORAL | 0 refills | Status: DC
Start: 2017-10-28 — End: 2017-11-28
  Administered 2017-10-28 – 2017-11-27 (×17): 17 g via ORAL

## 2017-10-29 LAB — CBC
Lab: 10 FL — ABNORMAL LOW (ref >60)
Lab: 15 % — ABNORMAL HIGH (ref 11–15)
Lab: 18 % — ABNORMAL LOW (ref 40–50)
Lab: 29 pg — ABNORMAL HIGH (ref 26–34)
Lab: 3.2 10*3/uL — ABNORMAL LOW (ref 4.5–11.0)
Lab: 34 g/dL — ABNORMAL HIGH (ref 32.0–36.0)
Lab: 77 10*3/uL — ABNORMAL LOW (ref >60)
Lab: 85 FL (ref 80–100)

## 2017-10-29 LAB — IRON + BINDING CAPACITY + %SAT+ FERRITIN
Lab: 15 ug/dL — ABNORMAL LOW (ref 50–185)
Lab: 268 ug/dL — ABNORMAL LOW (ref 270–380)
Lab: 6 % — ABNORMAL LOW (ref 28–42)

## 2017-10-29 LAB — PTT (APTT): Lab: 27 s (ref 24.0–36.5)

## 2017-10-29 LAB — POC GLUCOSE
Lab: 99 mg/dL — ABNORMAL HIGH (ref 70–100)
Lab: 89 mg/dL (ref 70–100)
Lab: 93 mg/dL — ABNORMAL LOW (ref 70–100)
Lab: 70 mg/dL (ref 70–100)

## 2017-10-29 LAB — POC ACTIVATED CLOTTING TIME: Lab: 290 s

## 2017-10-29 LAB — MAGNESIUM: Lab: 2.3 mg/dL — ABNORMAL LOW (ref 1.6–2.6)

## 2017-10-29 LAB — PHOSPHORUS: Lab: 6.5 mg/dL — ABNORMAL HIGH (ref 2.0–4.5)

## 2017-10-29 LAB — BASIC METABOLIC PANEL: Lab: 135 MMOL/L — ABNORMAL LOW (ref 137–147)

## 2017-10-29 MED ORDER — INSULIN GLARGINE 100 UNIT/ML (3 ML) SC INJ PEN
15 [IU] | Freq: Every evening | SUBCUTANEOUS | 0 refills | Status: DC
Start: 2017-10-29 — End: 2017-10-30

## 2017-10-29 MED ORDER — INSULIN ASPART 100 UNIT/ML SC FLEXPEN
5 [IU] | Freq: Three times a day (TID) | SUBCUTANEOUS | 0 refills | Status: DC
Start: 2017-10-29 — End: 2017-11-13

## 2017-10-30 DIAGNOSIS — L97521 Non-pressure chronic ulcer of other part of left foot limited to breakdown of skin: Secondary | ICD-10-CM

## 2017-10-30 LAB — BASIC METABOLIC PANEL
Lab: 134 MMOL/L — ABNORMAL LOW (ref >60)
Lab: 17 MMOL/L — ABNORMAL LOW (ref >60)
Lab: 5.2 MMOL/L — ABNORMAL HIGH (ref >60)

## 2017-10-30 LAB — POC GLUCOSE
Lab: 127 mg/dL — ABNORMAL HIGH (ref 70–100)
Lab: 155 mg/dL — ABNORMAL HIGH (ref 70–100)
Lab: 43 mg/dL — CL (ref 70–100)
Lab: 111 mg/dL — ABNORMAL HIGH (ref 70–100)
Lab: 84 mg/dL (ref 70–100)
Lab: 240 mg/dL — ABNORMAL HIGH (ref 70–100)

## 2017-10-30 LAB — CBC
Lab: 20 % — ABNORMAL LOW (ref 40–50)
Lab: 3.7 K/UL — ABNORMAL LOW (ref 4.5–11.0)
Lab: 6.9 g/dL — ABNORMAL LOW (ref >60)

## 2017-10-30 LAB — URINALYSIS MICROSCOPIC REFLEX TO CULTURE

## 2017-10-30 LAB — PHOSPHORUS: Lab: 6.7 mg/dL — ABNORMAL HIGH (ref 2.0–4.5)

## 2017-10-30 LAB — URINALYSIS DIPSTICK REFLEX TO CULTURE

## 2017-10-30 LAB — MAGNESIUM: Lab: 2.3 mg/dL (ref >60)

## 2017-10-30 MED ORDER — HYDROMORPHONE (PF) 2 MG/ML IJ SYRG
.5-1 mg | INTRAVENOUS | 0 refills | Status: DC | PRN
Start: 2017-10-30 — End: 2017-11-02
  Administered 2017-10-31 – 2017-11-01 (×4): 0.5 mg via INTRAVENOUS
  Administered 2017-11-02 (×2): 1 mg via INTRAVENOUS

## 2017-10-30 MED ORDER — INSULIN GLARGINE 100 UNIT/ML (3 ML) SC INJ PEN
10 [IU] | Freq: Every evening | SUBCUTANEOUS | 0 refills | Status: DC
Start: 2017-10-30 — End: 2017-10-31

## 2017-10-30 MED ORDER — ACETAMINOPHEN 325 MG PO TAB
650 mg | ORAL | 0 refills | Status: DC | PRN
Start: 2017-10-30 — End: 2017-11-02
  Administered 2017-10-31 – 2017-11-02 (×5): 650 mg via ORAL

## 2017-10-30 MED ORDER — HYDROMORPHONE (PF) 2 MG/ML IJ SYRG
1 mg | Freq: Once | INTRAVENOUS | 0 refills | Status: CP
Start: 2017-10-30 — End: ?
  Administered 2017-10-30: 21:00:00 1 mg via INTRAVENOUS

## 2017-10-30 MED ADMIN — DEXTROSE 50 % IN WATER (D50W) IV SYRG [2365]: 50 mL | INTRAVENOUS | @ 12:00:00 | Stop: 2017-10-30 | NDC 00409751716

## 2017-10-31 LAB — MAGNESIUM: Lab: 2.3 mg/dL — ABNORMAL LOW (ref 1.6–2.6)

## 2017-10-31 LAB — BASIC METABOLIC PANEL
Lab: 109 MMOL/L — ABNORMAL HIGH (ref >60)
Lab: 134 MMOL/L — ABNORMAL LOW (ref 137–147)
Lab: 135 MMOL/L — ABNORMAL LOW (ref 137–147)
Lab: 17 mL/min — ABNORMAL LOW (ref >60)
Lab: 20 mL/min — ABNORMAL LOW (ref >60)
Lab: 3.6 mg/dL — ABNORMAL HIGH (ref 0.4–1.24)
Lab: 5.7 MMOL/L — ABNORMAL HIGH (ref 3.5–5.1)
Lab: 7.3 mg/dL — ABNORMAL LOW (ref 8.5–10.6)

## 2017-10-31 LAB — POC GLUCOSE
Lab: 260 mg/dL — ABNORMAL HIGH (ref 70–100)
Lab: 284 mg/dL — ABNORMAL HIGH (ref 70–100)
Lab: 133 mg/dL — ABNORMAL HIGH (ref 70–100)
Lab: 130 mg/dL — ABNORMAL HIGH (ref 70–100)

## 2017-10-31 LAB — CBC: Lab: 3.1 K/UL — ABNORMAL LOW (ref >60)

## 2017-10-31 LAB — PHOSPHORUS: Lab: 6.8 mg/dL — ABNORMAL HIGH (ref >60)

## 2017-10-31 MED ORDER — SW WITH SODIUM BICARBONATE IV INFUSION
150 meq | INTRAVENOUS | 0 refills | Status: CP
Start: 2017-10-31 — End: ?
  Administered 2017-10-31 (×2): 150 meq via INTRAVENOUS

## 2017-10-31 MED ORDER — FUROSEMIDE 10 MG/ML IJ SOLN
40 mg | Freq: Once | INTRAVENOUS | 0 refills | Status: DC
Start: 2017-10-31 — End: 2017-10-31

## 2017-10-31 MED ORDER — DIPHENHYDRAMINE HCL 50 MG/ML IJ SOLN
25 mg | INTRAVENOUS | 0 refills | Status: DC | PRN
Start: 2017-10-31 — End: 2017-11-20
  Administered 2017-11-02 – 2017-11-20 (×2): 25 mg via INTRAVENOUS

## 2017-10-31 MED ORDER — METOLAZONE 5 MG PO TAB
5 mg | Freq: Once | ORAL | 0 refills | Status: CP
Start: 2017-10-31 — End: ?
  Administered 2017-11-01: 04:00:00 5 mg via ORAL

## 2017-10-31 MED ORDER — EPINEPHRINE HCL (PF) 1 MG/ML (1 ML) IJ SOLN
.3-.5 mg | INTRAMUSCULAR | 0 refills | Status: DC | PRN
Start: 2017-10-31 — End: 2017-11-13

## 2017-10-31 MED ORDER — IRON SUCROSE 100 MG IRON/5 ML IV SOLN
200 mg | Freq: Once | INTRAVENOUS | 0 refills | Status: CP
Start: 2017-10-31 — End: ?
  Administered 2017-10-31: 20:00:00 200 mg via INTRAVENOUS

## 2017-10-31 MED ORDER — FUROSEMIDE 10 MG/ML IJ SOLN
80 mg | Freq: Once | INTRAVENOUS | 0 refills | Status: CP
Start: 2017-10-31 — End: ?
  Administered 2017-10-31: 16:00:00 80 mg via INTRAVENOUS

## 2017-10-31 MED ORDER — INSULIN GLARGINE 100 UNIT/ML (3 ML) SC INJ PEN
12 [IU] | Freq: Every evening | SUBCUTANEOUS | 0 refills | Status: DC
Start: 2017-10-31 — End: 2017-11-02

## 2017-10-31 MED ORDER — FUROSEMIDE 10 MG/ML IJ SOLN
80 mg | Freq: Two times a day (BID) | INTRAVENOUS | 0 refills | Status: DC
Start: 2017-10-31 — End: 2017-11-02
  Administered 2017-11-01 – 2017-11-02 (×3): 80 mg via INTRAVENOUS

## 2017-11-01 LAB — POC GLUCOSE
Lab: 208 mg/dL — ABNORMAL HIGH (ref 70–100)
Lab: 191 mg/dL — ABNORMAL HIGH (ref 70–100)
Lab: 172 mg/dL — ABNORMAL HIGH (ref >60)
Lab: 161 mg/dL — ABNORMAL HIGH (ref 70–100)

## 2017-11-01 LAB — PHOSPHORUS: Lab: 6.4 mg/dL — ABNORMAL HIGH (ref >60)

## 2017-11-01 LAB — OCCULT BLOOD NON COLON CANCER SCREEN: Lab: POSITIVE K/UL — ABNORMAL HIGH (ref 1.0–4.8)

## 2017-11-01 LAB — BASIC METABOLIC PANEL: Lab: 134 MMOL/L — ABNORMAL LOW (ref 137–147)

## 2017-11-01 LAB — MAGNESIUM: Lab: 2.3 mg/dL — ABNORMAL HIGH (ref >60)

## 2017-11-01 LAB — CBC: Lab: 3.2 K/UL — ABNORMAL LOW (ref 4.5–11.0)

## 2017-11-01 MED ORDER — INSULIN GLARGINE 100 UNIT/ML (3 ML) SC INJ PEN
15 [IU] | Freq: Every evening | SUBCUTANEOUS | 0 refills | Status: DC
Start: 2017-11-01 — End: 2017-11-09

## 2017-11-01 MED ORDER — IRON SUCROSE 300 MG IRON/15 ML IV SOLN
300 mg | Freq: Every day | INTRAVENOUS | 0 refills | Status: CP
Start: 2017-11-01 — End: ?
  Administered 2017-11-02 – 2017-11-03 (×4): 300 mg via INTRAVENOUS

## 2017-11-02 LAB — PHOSPHORUS: Lab: 6 mg/dL — ABNORMAL HIGH (ref >60)

## 2017-11-02 LAB — BASIC METABOLIC PANEL: Lab: 137 MMOL/L — ABNORMAL LOW (ref 137–147)

## 2017-11-02 LAB — POC GLUCOSE
Lab: 312 mg/dL — ABNORMAL HIGH (ref 70–100)
Lab: 137 mg/dL — ABNORMAL HIGH (ref 70–100)
Lab: 122 mg/dL — ABNORMAL HIGH (ref 70–100)
Lab: 89 mg/dL (ref 70–100)

## 2017-11-02 LAB — PERIPHERAL SMEAR

## 2017-11-02 LAB — CBC: Lab: 3.8 K/UL — ABNORMAL LOW (ref 4.5–11.0)

## 2017-11-02 LAB — MAGNESIUM: Lab: 2.1 mg/dL — ABNORMAL LOW (ref 1.6–2.6)

## 2017-11-02 MED ORDER — FUROSEMIDE 10 MG/ML IJ SOLN
60 mg | Freq: Two times a day (BID) | INTRAVENOUS | 0 refills | Status: DC
Start: 2017-11-02 — End: 2017-11-05
  Administered 2017-11-02 – 2017-11-05 (×6): 60 mg via INTRAVENOUS

## 2017-11-02 MED ORDER — HYDROMORPHONE (PF) 2 MG/ML IJ SYRG
.5 mg | INTRAVENOUS | 0 refills | Status: DC | PRN
Start: 2017-11-02 — End: 2017-11-13
  Administered 2017-11-06 – 2017-11-07 (×3): 0.5 mg via INTRAVENOUS

## 2017-11-02 MED ORDER — EMU OIL 120ML
Freq: Two times a day (BID) | TOPICAL | 0 refills | Status: DC
Start: 2017-11-02 — End: 2017-11-28
  Administered 2017-11-02 – 2017-11-16 (×3): 120.000 mL via TOPICAL

## 2017-11-02 MED ORDER — ACETAMINOPHEN 325 MG PO TAB
650 mg | ORAL | 0 refills | Status: DC
Start: 2017-11-02 — End: 2017-11-15
  Administered 2017-11-02 – 2017-11-15 (×43): 650 mg via ORAL

## 2017-11-02 MED ORDER — OXYCODONE 10 MG PO TAB
10-15 mg | ORAL | 0 refills | Status: DC | PRN
Start: 2017-11-02 — End: 2017-11-03
  Administered 2017-11-02 – 2017-11-03 (×2): 10 mg via ORAL
  Administered 2017-11-03 (×2): 15 mg via ORAL

## 2017-11-03 LAB — BASIC METABOLIC PANEL
Lab: 111 mg/dL — ABNORMAL HIGH (ref 70–100)
Lab: 133 MMOL/L — ABNORMAL LOW (ref 137–147)
Lab: 19 mL/min — ABNORMAL LOW (ref >60)
Lab: 23 mL/min — ABNORMAL LOW (ref >60)
Lab: 25 MMOL/L (ref 21–30)
Lab: 3.3 mg/dL — ABNORMAL HIGH (ref 0.4–1.24)
Lab: 7 pg (ref 3–12)
Lab: 7.7 mg/dL — ABNORMAL LOW (ref 8.5–10.6)
Lab: 99 mg/dL — ABNORMAL HIGH (ref 7–25)

## 2017-11-03 LAB — POC GLUCOSE
Lab: 215 mg/dL — ABNORMAL HIGH (ref 70–100)
Lab: 150 mg/dL — ABNORMAL HIGH (ref 70–100)
Lab: 166 mg/dL — ABNORMAL HIGH (ref 70–100)
Lab: 264 mg/dL — ABNORMAL HIGH (ref 70–100)
Lab: 244 mg/dL — ABNORMAL HIGH (ref 70–100)

## 2017-11-03 LAB — MAGNESIUM: Lab: 2 mg/dL — ABNORMAL LOW (ref 1.6–2.6)

## 2017-11-03 LAB — IMMATURE PLATELET FRACTION: Lab: 2.3 % (ref 1.1–7.1)

## 2017-11-03 LAB — HIV 1& 2 AG-AB SCRN W REFLEX HIV 1 PCR QUANT: Lab: NEGATIVE MMOL/L (ref 21–30)

## 2017-11-03 LAB — HEPATITIS PANEL, ACUTE
Lab: NEGATIVE /HPF — AB (ref 3–12)
Lab: NEGATIVE U/L (ref 7–40)

## 2017-11-03 LAB — PNH,PI-LINKED ANTIGEN(PNHPB)

## 2017-11-03 LAB — LDH-LACTATE DEHYDROGENASE: Lab: 162 U/L (ref 100–210)

## 2017-11-03 LAB — CBC: Lab: 3.2 10*3/uL — ABNORMAL LOW (ref 4.5–11.0)

## 2017-11-03 LAB — URIC ACID: Lab: 9.6 mg/dL — ABNORMAL HIGH (ref 4.0–8.0)

## 2017-11-03 LAB — FOLATE, SERUM: Lab: >23 ng/mL (ref >3.9)

## 2017-11-03 LAB — PHOSPHORUS: Lab: 5.2 mg/dL — ABNORMAL HIGH (ref 2.0–4.5)

## 2017-11-03 MED ORDER — GABAPENTIN 100 MG PO CAP
100 mg | ORAL | 0 refills | Status: DC
Start: 2017-11-03 — End: 2017-11-13
  Administered 2017-11-03 – 2017-11-13 (×27): 100 mg via ORAL

## 2017-11-03 MED ORDER — OXYCODONE 10 MG PO TAB
10-15 mg | ORAL | 0 refills | Status: DC | PRN
Start: 2017-11-03 — End: 2017-11-06
  Administered 2017-11-03 – 2017-11-04 (×4): 15 mg via ORAL
  Administered 2017-11-04: 16:00:00 10 mg via ORAL
  Administered 2017-11-04 – 2017-11-05 (×2): 15 mg via ORAL
  Administered 2017-11-05: 20:00:00 10 mg via ORAL
  Administered 2017-11-05: 09:00:00 15 mg via ORAL
  Administered 2017-11-05: 10 mg via ORAL

## 2017-11-04 LAB — POC GLUCOSE
Lab: 296 mg/dL — ABNORMAL HIGH (ref 70–100)
Lab: 257 mg/dL — ABNORMAL HIGH (ref 70–100)
Lab: 128 mg/dL — ABNORMAL HIGH (ref 70–100)
Lab: 234 mg/dL — ABNORMAL HIGH (ref 70–100)
Lab: 222 mg/dL — ABNORMAL HIGH (ref 70–100)
Lab: 216 mg/dL — ABNORMAL HIGH (ref 70–100)

## 2017-11-04 LAB — PHOSPHORUS: Lab: 5 mg/dL — ABNORMAL HIGH (ref 2.0–4.5)

## 2017-11-04 LAB — MAGNESIUM: Lab: 2 mg/dL — ABNORMAL LOW (ref 1.6–2.6)

## 2017-11-04 LAB — BASIC METABOLIC PANEL: Lab: 139 MMOL/L — ABNORMAL LOW (ref >60)

## 2017-11-04 LAB — CBC: Lab: 3.9 K/UL — ABNORMAL LOW (ref >60)

## 2017-11-05 ENCOUNTER — Encounter: Admit: 2017-11-05 | Discharge: 2017-11-05 | Payer: MEDICARE

## 2017-11-05 DIAGNOSIS — Z9862 Peripheral vascular angioplasty status: ICD-10-CM

## 2017-11-05 DIAGNOSIS — I739 Peripheral vascular disease, unspecified: Principal | ICD-10-CM

## 2017-11-05 LAB — MAGNESIUM: Lab: 2 mg/dL — ABNORMAL HIGH (ref >60)

## 2017-11-05 LAB — BASIC METABOLIC PANEL: Lab: 137 MMOL/L — ABNORMAL LOW (ref >60)

## 2017-11-05 LAB — POC GLUCOSE
Lab: 230 mg/dL — ABNORMAL HIGH (ref 70–100)
Lab: 129 mg/dL — ABNORMAL HIGH (ref 70–100)
Lab: 186 mg/dL — ABNORMAL HIGH (ref 70–100)
Lab: 207 mg/dL — ABNORMAL HIGH (ref 70–100)

## 2017-11-05 LAB — COPPER: Lab: 1.3

## 2017-11-05 LAB — PHOSPHORUS: Lab: 4.6 mg/dL — ABNORMAL HIGH (ref 2.0–4.5)

## 2017-11-05 LAB — ANTI-NUCLEAR ANTIBODY(ANA): Lab: <80 {titer} — ABNORMAL HIGH (ref <80)

## 2017-11-05 LAB — CBC: Lab: 4.7 10*3/uL — ABNORMAL LOW (ref 4.5–11.0)

## 2017-11-05 MED ORDER — OXYCODONE 5 MG PO TAB
5-15 mg | ORAL | 0 refills | Status: DC | PRN
Start: 2017-11-05 — End: 2017-11-13
  Administered 2017-11-06 (×2): 5 mg via ORAL
  Administered 2017-11-06: 21:00:00 10 mg via ORAL
  Administered 2017-11-06: 12:00:00 5 mg via ORAL
  Administered 2017-11-06: 18:00:00 10 mg via ORAL
  Administered 2017-11-06 (×4): 5 mg via ORAL
  Administered 2017-11-07: 09:00:00 15 mg via ORAL
  Administered 2017-11-07 (×2): 10 mg via ORAL
  Administered 2017-11-07 – 2017-11-09 (×10): 15 mg via ORAL
  Administered 2017-11-10: 12:00:00 10 mg via ORAL
  Administered 2017-11-10: 13:00:00 5 mg via ORAL
  Administered 2017-11-11 (×4): 10 mg via ORAL
  Administered 2017-11-12 (×4): 15 mg via ORAL
  Administered 2017-11-12: 10 mg via ORAL
  Administered 2017-11-13 (×2): 15 mg via ORAL

## 2017-11-05 MED ORDER — IMS MIXTURE TEMPLATE
60 mg | Freq: Two times a day (BID) | ORAL | 0 refills | Status: DC
Start: 2017-11-05 — End: 2017-11-09
  Administered 2017-11-05 – 2017-11-09 (×12): 60 mg via ORAL

## 2017-11-06 ENCOUNTER — Inpatient Hospital Stay: Admit: 2017-10-27 | Discharge: 2017-10-27 | Payer: MEDICARE

## 2017-11-06 DIAGNOSIS — L97521 Non-pressure chronic ulcer of other part of left foot limited to breakdown of skin: Secondary | ICD-10-CM

## 2017-11-06 LAB — POC GLUCOSE
Lab: 264 mg/dL — ABNORMAL HIGH (ref 70–100)
Lab: 108 mg/dL — ABNORMAL HIGH (ref 70–100)
Lab: 120 mg/dL — ABNORMAL HIGH (ref 70–100)
Lab: 170 mg/dL — ABNORMAL HIGH (ref 70–100)

## 2017-11-06 LAB — BASIC METABOLIC PANEL: Lab: 136 MMOL/L — ABNORMAL LOW (ref >60)

## 2017-11-06 LAB — CBC AND DIFF: Lab: 5 K/UL — ABNORMAL HIGH (ref 4.5–11.0)

## 2017-11-06 LAB — PHOSPHORUS: Lab: 4.4 mg/dL — ABNORMAL HIGH (ref 2.0–4.5)

## 2017-11-06 LAB — MAGNESIUM: Lab: 2 mg/dL — ABNORMAL LOW (ref >60)

## 2017-11-06 LAB — PROTIME INR (PT): Lab: 1.1 (ref 0.8–1.2)

## 2017-11-06 LAB — PTT (APTT): Lab: 29 s (ref 24.0–36.5)

## 2017-11-06 LAB — VITAMIN B12: Lab: 662 pg/mL — ABNORMAL LOW (ref 180–914)

## 2017-11-06 LAB — FIBRINOGEN: Lab: 634 mg/dL — ABNORMAL HIGH (ref 200–400)

## 2017-11-06 MED ORDER — SODIUM CHLORIDE 0.9 % IV SOLP
1000 mL | INTRAVENOUS | 0 refills | Status: DC
Start: 2017-11-06 — End: 2017-11-08
  Administered 2017-11-07: 05:00:00 1000 mL via INTRAVENOUS

## 2017-11-06 MED ORDER — MUPIROCIN 2 % TP OINT
Freq: Two times a day (BID) | TOPICAL | 0 refills | Status: DC
Start: 2017-11-06 — End: 2017-11-09
  Administered 2017-11-06: 15:00:00 via TOPICAL

## 2017-11-07 ENCOUNTER — Encounter: Admit: 2017-11-07 | Discharge: 2017-11-07 | Payer: MEDICARE

## 2017-11-07 DIAGNOSIS — R0600 Dyspnea, unspecified: ICD-10-CM

## 2017-11-07 DIAGNOSIS — L97521 Non-pressure chronic ulcer of other part of left foot limited to breakdown of skin: Secondary | ICD-10-CM

## 2017-11-07 DIAGNOSIS — E119 Type 2 diabetes mellitus without complications: ICD-10-CM

## 2017-11-07 DIAGNOSIS — I10 Essential (primary) hypertension: Principal | ICD-10-CM

## 2017-11-07 DIAGNOSIS — E785 Hyperlipidemia, unspecified: ICD-10-CM

## 2017-11-07 DIAGNOSIS — F17200 Nicotine dependence, unspecified, uncomplicated: ICD-10-CM

## 2017-11-07 DIAGNOSIS — K859 Acute pancreatitis without necrosis or infection, unspecified: ICD-10-CM

## 2017-11-07 LAB — IONIZED CALCIUM,BG: Lab: 1 MMOL/L — ABNORMAL HIGH (ref 1.0–1.3)

## 2017-11-07 LAB — POC GLUCOSE
Lab: 159 mg/dL — ABNORMAL HIGH (ref 70–100)
Lab: 77 mg/dL (ref 70–100)
Lab: 92 mg/dL (ref 70–100)

## 2017-11-07 LAB — BASIC METABOLIC PANEL: Lab: 138 MMOL/L — ABNORMAL LOW (ref 137–147)

## 2017-11-07 LAB — BLOOD GASES, ARTERIAL: Lab: 7.4 % — ABNORMAL LOW (ref 7.35–7.45)

## 2017-11-07 LAB — POTASSIUM, BG: Lab: 3.5 MMOL/L (ref 3.5–5.1)

## 2017-11-07 LAB — HEMOGLOBIN & HEMATOCRIT, BG: Lab: 7.2 g/dL — ABNORMAL LOW (ref 13.5–16.5)

## 2017-11-07 LAB — GLUCOSE,BG: Lab: 77 mg/dL (ref 70–100)

## 2017-11-07 LAB — MAGNESIUM: Lab: 1.9 mg/dL — ABNORMAL LOW (ref >60)

## 2017-11-07 LAB — CBC: Lab: 6.2 K/UL — ABNORMAL LOW (ref >60)

## 2017-11-07 LAB — LACTIC ACID (BG - RAPID LACTATE): Lab: 0.4 MMOL/L — ABNORMAL LOW (ref 0.5–2.0)

## 2017-11-07 LAB — PHOSPHORUS: Lab: 4.6 mg/dL — ABNORMAL HIGH (ref 2.0–4.5)

## 2017-11-07 LAB — SODIUM,BG: Lab: 141 MMOL/L (ref 137–147)

## 2017-11-07 MED ORDER — DEXAMETHASONE SODIUM PHOSPHATE 4 MG/ML IJ SOLN
INTRAVENOUS | 0 refills | Status: DC
Start: 2017-11-07 — End: 2017-11-07
  Administered 2017-11-07: 19:00:00 4 mg via INTRAVENOUS

## 2017-11-07 MED ORDER — GLYCOPYRROLATE 0.2 MG/ML IJ SOLN
0 refills | Status: DC
Start: 2017-11-07 — End: 2017-11-07
  Administered 2017-11-07: 23:00:00 0.6 mg via INTRAVENOUS

## 2017-11-07 MED ORDER — LINEZOLID IN DEXTROSE 5% 600 MG/300 ML IV PGBK
600 mg | Freq: Two times a day (BID) | INTRAVENOUS | 0 refills | Status: DC
Start: 2017-11-07 — End: 2017-11-14
  Administered 2017-11-08 – 2017-11-14 (×14): 600 mg via INTRAVENOUS

## 2017-11-07 MED ORDER — HYDROMORPHONE (PF) 2 MG/ML IJ SYRG
.5 mg | INTRAVENOUS | 0 refills | Status: DC | PRN
Start: 2017-11-07 — End: 2017-11-08

## 2017-11-07 MED ORDER — FENTANYL CITRATE (PF) 50 MCG/ML IJ SOLN
25 ug | INTRAVENOUS | 0 refills | Status: DC | PRN
Start: 2017-11-07 — End: 2017-11-08

## 2017-11-07 MED ORDER — HYDROMORPHONE (PF) 2 MG/ML IJ SYRG
0 refills | Status: DC
Start: 2017-11-07 — End: 2017-11-07
  Administered 2017-11-07 (×2): .2 mg via INTRAVENOUS

## 2017-11-07 MED ORDER — ELECTROLYTE-A IV SOLP
0 refills | Status: DC
Start: 2017-11-07 — End: 2017-11-07
  Administered 2017-11-07: 18:00:00 via INTRAVENOUS

## 2017-11-07 MED ORDER — HEPARIN 5,000 UNITS IN LR 500 ML IRR
Freq: Once | 0 refills | Status: AC
Start: 2017-11-07 — End: ?

## 2017-11-07 MED ORDER — NEOSTIGMINE METHYLSULFATE 1 MG/ML IJ SOLN
0 refills | Status: DC
Start: 2017-11-07 — End: 2017-11-07
  Administered 2017-11-07: 23:00:00 3 mg via INTRAVENOUS

## 2017-11-07 MED ORDER — CEFAZOLIN 1 GRAM IJ SOLR
0 refills | Status: DC
Start: 2017-11-07 — End: 2017-11-07
  Administered 2017-11-07 (×2): 2 g via INTRAVENOUS

## 2017-11-07 MED ORDER — SODIUM CHLORIDE 0.9 % IV SOLP
1000 mL | INTRAVENOUS | 0 refills | Status: DC
Start: 2017-11-07 — End: 2017-11-08
  Administered 2017-11-07: 17:00:00 1000 mL via INTRAVENOUS

## 2017-11-07 MED ORDER — FENTANYL CITRATE (PF) 50 MCG/ML IJ SOLN
0 refills | Status: DC
Start: 2017-11-07 — End: 2017-11-07
  Administered 2017-11-07: 20:00:00 50 ug via INTRAVENOUS
  Administered 2017-11-07: 18:00:00 100 ug via INTRAVENOUS
  Administered 2017-11-07: 20:00:00 25 ug via INTRAVENOUS

## 2017-11-07 MED ORDER — ROCURONIUM 10 MG/ML IV SOLN
INTRAVENOUS | 0 refills | Status: DC
Start: 2017-11-07 — End: 2017-11-07
  Administered 2017-11-07: 20:00:00 10 mg via INTRAVENOUS
  Administered 2017-11-07: 18:00:00 40 mg via INTRAVENOUS
  Administered 2017-11-07: 21:00:00 10 mg via INTRAVENOUS

## 2017-11-07 MED ORDER — PROTAMINE 10 MG/ML IV SOLN
0 refills | Status: DC
Start: 2017-11-07 — End: 2017-11-07
  Administered 2017-11-07: 22:00:00 20 mg via INTRAVENOUS

## 2017-11-07 MED ORDER — DIPHENHYDRAMINE HCL 50 MG/ML IJ SOLN
25 mg | Freq: Once | INTRAVENOUS | 0 refills | Status: DC | PRN
Start: 2017-11-07 — End: 2017-11-08

## 2017-11-07 MED ORDER — PIPERACILLIN/TAZOBACTAM 2.25 G/NS IVPB (MB+)
2.25 g | INTRAVENOUS | 0 refills | Status: DC
Start: 2017-11-07 — End: 2017-11-14
  Administered 2017-11-08 – 2017-11-14 (×52): 2.25 g via INTRAVENOUS

## 2017-11-07 MED ORDER — LIDOCAINE (PF) 200 MG/10 ML (2 %) IJ SYRG
0 refills | Status: DC
Start: 2017-11-07 — End: 2017-11-07
  Administered 2017-11-07: 18:00:00 60 mg via INTRAVENOUS

## 2017-11-07 MED ORDER — KETAMINE 10 MG/ML IJ SOLN
0 refills | Status: DC
Start: 2017-11-07 — End: 2017-11-07
  Administered 2017-11-07 (×3): 10 mg via INTRAVENOUS

## 2017-11-07 MED ORDER — GABAPENTIN 300 MG PO CAP
300 mg | Freq: Once | ORAL | 0 refills | Status: CP
Start: 2017-11-07 — End: ?
  Administered 2017-11-07: 17:00:00 300 mg via ORAL

## 2017-11-07 MED ORDER — DEXTRAN 70-HYPROMELLOSE (PF) 0.1-0.3 % OP DPET
0 refills | Status: DC
Start: 2017-11-07 — End: 2017-11-07
  Administered 2017-11-07: 18:00:00 1 [drp] via OPHTHALMIC

## 2017-11-07 MED ORDER — HEPARIN 5,000 UNITS IN LR 500 ML IRR
0 refills | Status: DC
Start: 2017-11-07 — End: 2017-11-07
  Administered 2017-11-07: 22:00:00 500 mL

## 2017-11-07 MED ORDER — PROPOFOL INJ 10 MG/ML IV VIAL
0 refills | Status: DC
Start: 2017-11-07 — End: 2017-11-07
  Administered 2017-11-07: 18:00:00 70 mg via INTRAVENOUS

## 2017-11-07 MED ORDER — HEPARIN (PORCINE) 1,000 UNIT/ML IJ SOLN
0 refills | Status: DC
Start: 2017-11-07 — End: 2017-11-07
  Administered 2017-11-07: 20:00:00 8000 [IU] via INTRAVENOUS

## 2017-11-08 LAB — POC GLUCOSE
Lab: 158 mg/dL — ABNORMAL HIGH (ref 70–100)
Lab: 119 mg/dL — ABNORMAL HIGH (ref 70–100)
Lab: 312 mg/dL — ABNORMAL HIGH (ref 70–100)
Lab: 286 mg/dL — ABNORMAL HIGH (ref 70–100)

## 2017-11-08 LAB — MAGNESIUM: Lab: 2 mg/dL — ABNORMAL LOW (ref >60)

## 2017-11-08 LAB — PHOSPHORUS: Lab: 5.2 mg/dL — ABNORMAL HIGH (ref >60)

## 2017-11-08 LAB — BASIC METABOLIC PANEL: Lab: 137 MMOL/L — ABNORMAL LOW (ref 137–147)

## 2017-11-08 LAB — CBC: Lab: 6.8 K/UL — ABNORMAL HIGH (ref 4.5–11.0)

## 2017-11-08 MED ORDER — ASPIRIN 81 MG PO CHEW
81 mg | Freq: Every day | ORAL | 0 refills | Status: DC
Start: 2017-11-08 — End: 2017-11-13
  Administered 2017-11-08 – 2017-11-12 (×5): 81 mg via ORAL

## 2017-11-09 ENCOUNTER — Encounter: Admit: 2017-11-09 | Discharge: 2017-11-09 | Payer: MEDICARE

## 2017-11-09 DIAGNOSIS — I10 Essential (primary) hypertension: Principal | ICD-10-CM

## 2017-11-09 DIAGNOSIS — R0600 Dyspnea, unspecified: ICD-10-CM

## 2017-11-09 DIAGNOSIS — E785 Hyperlipidemia, unspecified: ICD-10-CM

## 2017-11-09 DIAGNOSIS — K859 Acute pancreatitis without necrosis or infection, unspecified: ICD-10-CM

## 2017-11-09 DIAGNOSIS — E119 Type 2 diabetes mellitus without complications: ICD-10-CM

## 2017-11-09 DIAGNOSIS — F17200 Nicotine dependence, unspecified, uncomplicated: ICD-10-CM

## 2017-11-09 LAB — MAGNESIUM: Lab: 2 mg/dL — ABNORMAL LOW (ref >60)

## 2017-11-09 LAB — CBC
Lab: 5.5 K/UL — ABNORMAL LOW (ref 4.5–11.0)
Lab: 15 % — ABNORMAL HIGH (ref 11–15)
Lab: 2.6 M/UL — ABNORMAL LOW (ref >60)
Lab: 30 pg — ABNORMAL LOW (ref >60)
Lab: 33 g/dL (ref 32.0–36.0)
Lab: 5.7 10*3/uL — ABNORMAL HIGH (ref >60)
Lab: 8.8 FL (ref 7–11)
Lab: 83 10*3/uL — ABNORMAL LOW (ref 150–400)
Lab: 91 FL — ABNORMAL LOW (ref >60)

## 2017-11-09 LAB — POC GLUCOSE
Lab: 416 mg/dL — ABNORMAL HIGH (ref 70–100)
Lab: 245 mg/dL — ABNORMAL HIGH (ref 70–100)
Lab: 276 mg/dL — ABNORMAL HIGH (ref 70–100)
Lab: 384 mg/dL — ABNORMAL HIGH (ref 70–100)

## 2017-11-09 LAB — PHOSPHORUS: Lab: 4.2 mg/dL — ABNORMAL HIGH (ref 2.0–4.5)

## 2017-11-09 LAB — BASIC METABOLIC PANEL: Lab: 136 MMOL/L — ABNORMAL LOW (ref >60)

## 2017-11-09 MED ORDER — INSULIN GLARGINE 100 UNIT/ML (3 ML) SC INJ PEN
20 [IU] | Freq: Every evening | SUBCUTANEOUS | 0 refills | Status: DC
Start: 2017-11-09 — End: 2017-11-10

## 2017-11-09 MED ORDER — MUPIROCIN 2 % TP OINT
Freq: Every day | TOPICAL | 0 refills | Status: DC
Start: 2017-11-09 — End: 2017-11-28
  Administered 2017-11-10: 16:00:00 via TOPICAL

## 2017-11-09 MED ORDER — FUROSEMIDE 80 MG PO TAB
80 mg | Freq: Two times a day (BID) | ORAL | 0 refills | Status: DC
Start: 2017-11-09 — End: 2017-11-13
  Administered 2017-11-09 – 2017-11-12 (×7): 80 mg via ORAL

## 2017-11-09 MED ORDER — TAMSULOSIN 0.4 MG PO CAP
0.4 mg | Freq: Every day | ORAL | 0 refills | Status: DC
Start: 2017-11-09 — End: 2017-11-12
  Administered 2017-11-09 – 2017-11-12 (×4): 0.4 mg via ORAL

## 2017-11-10 LAB — POC GLUCOSE
Lab: 206 mg/dL — ABNORMAL HIGH (ref 70–100)
Lab: 252 mg/dL — ABNORMAL HIGH (ref 70–100)
Lab: 266 mg/dL — ABNORMAL HIGH (ref 70–100)
Lab: 337 mg/dL — ABNORMAL HIGH (ref 70–100)
Lab: 352 mg/dL — ABNORMAL HIGH (ref 70–100)

## 2017-11-10 LAB — MAGNESIUM: Lab: 1.9 mg/dL — ABNORMAL LOW (ref 1.6–2.6)

## 2017-11-10 LAB — PHOSPHORUS: Lab: 3.9 mg/dL — ABNORMAL HIGH (ref 2.0–4.5)

## 2017-11-10 LAB — BASIC METABOLIC PANEL: Lab: 135 MMOL/L — ABNORMAL LOW (ref >60)

## 2017-11-10 LAB — CBC: Lab: 4.9 K/UL — ABNORMAL LOW (ref 4.5–11.0)

## 2017-11-10 MED ORDER — INSULIN GLARGINE 100 UNIT/ML (3 ML) SC INJ PEN
25 [IU] | Freq: Every evening | SUBCUTANEOUS | 0 refills | Status: DC
Start: 2017-11-10 — End: 2017-11-11

## 2017-11-11 LAB — POC GLUCOSE
Lab: 287 mg/dL — ABNORMAL HIGH (ref 70–100)
Lab: 222 mg/dL — ABNORMAL HIGH (ref 70–100)
Lab: 243 mg/dL — ABNORMAL HIGH (ref 70–100)

## 2017-11-11 LAB — BASIC METABOLIC PANEL: Lab: 134 MMOL/L — ABNORMAL LOW (ref 137–147)

## 2017-11-11 LAB — MAGNESIUM: Lab: 1.9 mg/dL — ABNORMAL LOW (ref 1.6–2.6)

## 2017-11-11 LAB — PHOSPHORUS: Lab: 3.5 mg/dL — ABNORMAL LOW (ref 2.0–4.5)

## 2017-11-11 LAB — CBC: Lab: 4.4 K/UL — ABNORMAL LOW (ref >60)

## 2017-11-11 MED ORDER — INSULIN GLARGINE 100 UNIT/ML (3 ML) SC INJ PEN
30 [IU] | Freq: Every evening | SUBCUTANEOUS | 0 refills | Status: DC
Start: 2017-11-11 — End: 2017-11-14
  Administered 2017-11-13: 05:00:00 30 [IU] via SUBCUTANEOUS

## 2017-11-12 ENCOUNTER — Encounter: Admit: 2017-11-12 | Discharge: 2017-11-12 | Payer: MEDICARE

## 2017-11-12 LAB — CBC
Lab: 4.3 K/UL — ABNORMAL LOW (ref 4.5–11.0)
Lab: 1.8 M/UL — ABNORMAL LOW (ref 4.4–5.5)
Lab: 100 10*3/uL — ABNORMAL LOW (ref 150–400)
Lab: 14 % (ref 11–15)
Lab: 16 % — ABNORMAL LOW (ref 40–50)
Lab: 29 pg (ref 26–34)
Lab: 34 g/dL (ref 32.0–36.0)
Lab: 5.5 g/dL — CL (ref 13.5–16.5)
Lab: 5.9 10*3/uL (ref 4.5–11.0)
Lab: 8.4 FL (ref 7–11)
Lab: 86 FL (ref 80–100)

## 2017-11-12 LAB — POC GLUCOSE
Lab: 276 mg/dL — ABNORMAL HIGH (ref 70–100)
Lab: 270 mg/dL — ABNORMAL HIGH (ref 70–100)
Lab: 247 mg/dL — ABNORMAL HIGH (ref 70–100)
Lab: 229 mg/dL — ABNORMAL HIGH (ref 70–100)
Lab: 189 mg/dL — ABNORMAL HIGH (ref 70–100)

## 2017-11-12 LAB — BASIC METABOLIC PANEL: Lab: 135 MMOL/L — ABNORMAL LOW (ref >60)

## 2017-11-12 LAB — MAGNESIUM: Lab: 1.9 mg/dL — ABNORMAL LOW (ref 1.6–2.6)

## 2017-11-12 LAB — PHOSPHORUS: Lab: 3.7 mg/dL — ABNORMAL LOW (ref >60)

## 2017-11-12 MED ORDER — PANTOPRAZOLE 40 MG IV SOLR
80 mg | INTRAVENOUS | 0 refills | Status: DC
Start: 2017-11-12 — End: 2017-11-13

## 2017-11-12 MED ORDER — TAMSULOSIN 0.4 MG PO CAP
.8 mg | Freq: Every day | ORAL | 0 refills | Status: DC
Start: 2017-11-12 — End: 2017-11-13

## 2017-11-12 MED ORDER — OCTREOTIDE IV DRIP
50 ug/h | INTRAVENOUS | 0 refills | Status: AC
Start: 2017-11-12 — End: ?
  Administered 2017-11-13 – 2017-11-15 (×12): 50 ug/h via INTRAVENOUS

## 2017-11-12 MED ORDER — PANTOPRAZOLE 40 MG IV SOLR
80 mg | Freq: Once | INTRAVENOUS | 0 refills | Status: CP
Start: 2017-11-12 — End: ?
  Administered 2017-11-13: 03:00:00 80 mg via INTRAVENOUS

## 2017-11-12 MED ORDER — CEFTRIAXONE INJ 1GM IVP
1 g | INTRAVENOUS | 0 refills | Status: DC
Start: 2017-11-12 — End: 2017-11-13

## 2017-11-12 MED ORDER — PANTOPRAZOLE IV INFUSION
8 mg/h | INTRAVENOUS | 0 refills | Status: DC
Start: 2017-11-12 — End: 2017-11-14
  Administered 2017-11-13 – 2017-11-14 (×8): 8 mg/h via INTRAVENOUS

## 2017-11-12 MED ORDER — PANTOPRAZOLE IV INFUSION
8 mg/h | INTRAVENOUS | 0 refills | Status: DC
Start: 2017-11-12 — End: 2017-11-13

## 2017-11-12 MED ORDER — OCTREOTIDE (SANDOSTATIN) BOLUS FOR CONTINUOUS INFUSION
50 ug | Freq: Once | INTRAVENOUS | 0 refills | Status: CP
Start: 2017-11-12 — End: ?

## 2017-11-12 MED ORDER — LACTATED RINGERS IV SOLP
500 mL | Freq: Once | INTRAVENOUS | 0 refills | Status: CP
Start: 2017-11-12 — End: ?
  Administered 2017-11-13: 03:00:00 500 mL via INTRAVENOUS

## 2017-11-13 ENCOUNTER — Encounter: Admit: 2017-11-13 | Discharge: 2017-11-13 | Payer: MEDICARE

## 2017-11-13 LAB — COMPREHENSIVE METABOLIC PANEL
Lab: 0.3 mg/dL (ref 0.3–1.2)
Lab: 11 U/L (ref 7–40)
Lab: 136 MMOL/L — ABNORMAL LOW (ref 137–147)
Lab: 18 mL/min — ABNORMAL LOW (ref >60)
Lab: 200 mg/dL — ABNORMAL HIGH (ref 70–100)
Lab: 27 MMOL/L (ref 21–30)
Lab: 3.3 mg/dL — ABNORMAL HIGH (ref 0.4–1.24)
Lab: 3.7 g/dL — ABNORMAL LOW (ref 6.0–8.0)
Lab: 35 U/L (ref 25–110)
Lab: 4.4 MMOL/L (ref 3.5–5.1)
Lab: 9 (ref 3–12)
Lab: 134 MMOL/L — ABNORMAL LOW (ref >60)

## 2017-11-13 LAB — CBC
Lab: 1.7 M/UL — ABNORMAL LOW (ref 4.4–5.5)
Lab: 119 10*3/uL — ABNORMAL LOW (ref 150–400)
Lab: 15 % (ref 11–15)
Lab: 15 % — ABNORMAL LOW (ref 40–50)
Lab: 30 pg (ref 26–34)
Lab: 34 g/dL (ref 32.0–36.0)
Lab: 5.3 g/dL — CL (ref 13.5–16.5)
Lab: 6.8 10*3/uL (ref 4.5–11.0)
Lab: 8.2 FL (ref 7–11)
Lab: 89 FL (ref 80–100)
Lab: 116 10*3/uL — ABNORMAL LOW (ref 150–400)
Lab: 14 % (ref 11–15)
Lab: 19 % — ABNORMAL LOW (ref 40–50)
Lab: 2.1 M/UL — ABNORMAL LOW (ref 4.4–5.5)
Lab: 29 pg — ABNORMAL LOW (ref >60)
Lab: 7.9 10*3/uL — ABNORMAL LOW (ref 4.5–11.0)
Lab: 8.6 FL (ref 7–11)
Lab: 110 10*3/uL — ABNORMAL LOW (ref 150–400)
Lab: 13 % (ref 11–15)
Lab: 2.8 M/UL — ABNORMAL LOW (ref 4.4–5.5)
Lab: 25 % — ABNORMAL LOW (ref 40–50)
Lab: 30 pg (ref 26–34)
Lab: 34 g/dL (ref 32.0–36.0)
Lab: 8.9 g/dL — ABNORMAL LOW (ref 13.5–16.5)
Lab: 89 FL (ref 80–100)
Lab: 9.3 10*3/uL (ref 4.5–11.0)
Lab: 13 % (ref 11–15)
Lab: 2.7 M/UL — ABNORMAL LOW (ref 4.4–5.5)
Lab: 24 % — ABNORMAL LOW (ref 40–50)
Lab: 31 pg (ref >60)
Lab: 8.6 g/dL — ABNORMAL LOW (ref 13.5–16.5)
Lab: 8.9 FL (ref 7–11)
Lab: 89 FL (ref 80–100)
Lab: 9.1 10*3/uL — ABNORMAL HIGH (ref 4.5–11.0)
Lab: 115 K/UL — ABNORMAL LOW (ref 150–400)
Lab: 14 % — ABNORMAL LOW (ref 11–15)
Lab: 2.2 M/UL — ABNORMAL LOW (ref 4.4–5.5)
Lab: 20 % — ABNORMAL LOW (ref 40–50)
Lab: 30 pg (ref 26–34)
Lab: 33 g/dL — ABNORMAL HIGH (ref 32.0–36.0)
Lab: 6.8 g/dL — ABNORMAL LOW (ref 13.5–16.5)
Lab: 7.8 K/UL — ABNORMAL LOW (ref 4.5–11.0)
Lab: 8.1 FL (ref 7–11)
Lab: 91 FL — ABNORMAL LOW (ref 80–100)
Lab: 113 10*3/uL — ABNORMAL LOW (ref 150–400)
Lab: 14 % (ref 11–15)
Lab: 2.8 M/UL — ABNORMAL LOW (ref 4.4–5.5)
Lab: 25 % — ABNORMAL LOW (ref 40–50)
Lab: 30 pg (ref 26–34)
Lab: 33 g/dL (ref 32.0–36.0)
Lab: 7.7 10*3/uL (ref 4.5–11.0)
Lab: 8.3 FL (ref 7–11)
Lab: 8.5 g/dL — ABNORMAL LOW (ref 13.5–16.5)
Lab: 89 FL (ref 80–100)

## 2017-11-13 LAB — BILIRUBIN, DIRECT: Lab: 0.1 mg/dL (ref <0.4)

## 2017-11-13 LAB — MAGNESIUM: Lab: 2 mg/dL — ABNORMAL LOW (ref >60)

## 2017-11-13 LAB — TROPONIN-I

## 2017-11-13 LAB — PROTIME INR (PT): Lab: 1.2 (ref 0.8–1.2)

## 2017-11-13 LAB — PTT (APTT): Lab: 27 s (ref 24.0–36.5)

## 2017-11-13 LAB — POC GLUCOSE
Lab: 219 mg/dL — ABNORMAL HIGH (ref 70–100)
Lab: 264 mg/dL — ABNORMAL HIGH (ref 70–100)
Lab: 190 mg/dL — ABNORMAL HIGH (ref 70–100)
Lab: 164 mg/dL — ABNORMAL HIGH (ref 70–100)

## 2017-11-13 LAB — PHOSPHORUS: Lab: 6 mg/dL — ABNORMAL HIGH (ref >60)

## 2017-11-13 LAB — LACTIC ACID(LACTATE): Lab: 0.7 MMOL/L (ref 0.5–2.0)

## 2017-11-13 MED ORDER — FENTANYL CITRATE (PF) 50 MCG/ML IJ SOLN
50-100 ug | Freq: Once | INTRAVENOUS | 0 refills | Status: CP
Start: 2017-11-13 — End: ?
  Administered 2017-11-13: 06:00:00 50 ug via INTRAVENOUS

## 2017-11-13 MED ORDER — NOREPINEPHRINE IV DRIP (STD CONC)
.01-.3 ug/kg/min | INTRAVENOUS | 0 refills | Status: DC
Start: 2017-11-13 — End: 2017-11-14
  Administered 2017-11-13 (×2): 0.04 ug/kg/min via INTRAVENOUS

## 2017-11-13 MED ORDER — SODIUM CHLORIDE 0.9 % IV SOLP
INTRAVENOUS | 0 refills | Status: CN
Start: 2017-11-13 — End: ?

## 2017-11-13 MED ORDER — MIDAZOLAM 1 MG/ML IJ SOLN
4 mg | Freq: Once | INTRAVENOUS | 0 refills | Status: CP
Start: 2017-11-13 — End: ?

## 2017-11-13 MED ORDER — HYDROMORPHONE (PF) 2 MG/ML IJ SYRG
.5 mg | INTRAVENOUS | 0 refills | Status: DC | PRN
Start: 2017-11-13 — End: 2017-11-15
  Administered 2017-11-13 – 2017-11-15 (×4): 0.5 mg via INTRAVENOUS

## 2017-11-13 MED ORDER — METOCLOPRAMIDE IVPB
20 mg | Freq: Two times a day (BID) | INTRAVENOUS | 0 refills | Status: DC
Start: 2017-11-13 — End: 2017-11-13

## 2017-11-13 MED ORDER — LACTULOSE 10 GRAM/15 ML PO SOLN
30 mL | Freq: Three times a day (TID) | ORAL | 0 refills | Status: DC
Start: 2017-11-13 — End: 2017-11-18
  Administered 2017-11-14 – 2017-11-18 (×9): 20 g via ORAL

## 2017-11-13 MED ORDER — MIDAZOLAM 1 MG/ML IJ SOLN
1 mg | Freq: Once | INTRAVENOUS | 0 refills | Status: DC
Start: 2017-11-13 — End: 2017-11-13

## 2017-11-13 MED ORDER — METOCLOPRAMIDE IVPB
20 mg | Freq: Two times a day (BID) | INTRAVENOUS | 0 refills | Status: CP
Start: 2017-11-13 — End: ?
  Administered 2017-11-13 (×4): 20 mg via INTRAVENOUS

## 2017-11-13 MED ORDER — PANTOPRAZOLE 40 MG IV SOLR
80 mg | Freq: Every day | INTRAVENOUS | 0 refills | Status: DC
Start: 2017-11-13 — End: 2017-11-13

## 2017-11-13 MED ORDER — MIDAZOLAM 1 MG/ML IJ SOLN
1-2 mg | Freq: Once | INTRAVENOUS | 0 refills | Status: CP
Start: 2017-11-13 — End: ?

## 2017-11-13 MED ORDER — MIDAZOLAM 1 MG/ML IJ SOLN
1-5 mg | Freq: Once | INTRAVENOUS | 0 refills | Status: DC
Start: 2017-11-13 — End: 2017-11-13

## 2017-11-13 MED ORDER — FENTANYL CITRATE (PF) 50 MCG/ML IJ SOLN
25-50 ug | Freq: Once | INTRAVENOUS | 0 refills | Status: CP
Start: 2017-11-13 — End: ?

## 2017-11-13 MED ORDER — INSULIN GLARGINE 100 UNIT/ML (3 ML) SC INJ PEN
10 [IU] | Freq: Every evening | SUBCUTANEOUS | 0 refills | Status: DC
Start: 2017-11-13 — End: 2017-11-14

## 2017-11-13 MED ORDER — RIFAXIMIN 550 MG PO TAB
550 mg | Freq: Two times a day (BID) | ORAL | 0 refills | Status: DC
Start: 2017-11-13 — End: 2017-11-28
  Administered 2017-11-14 – 2017-11-28 (×30): 550 mg via ORAL

## 2017-11-13 MED ORDER — FENTANYL CITRATE (PF) 50 MCG/ML IJ SOLN
12.5-25 ug | INTRAVENOUS | 0 refills | Status: DC | PRN
Start: 2017-11-13 — End: 2017-11-13

## 2017-11-13 MED ADMIN — MIDAZOLAM 1 MG/ML IJ SOLN [10607]: 2 mg | INTRAVENOUS | @ 18:00:00 | Stop: 2017-11-13 | NDC 00409230521

## 2017-11-13 MED ADMIN — FENTANYL CITRATE (PF) 50 MCG/ML IJ SOLN [3037]: 50 ug | INTRAVENOUS | @ 06:00:00 | Stop: 2017-11-13 | NDC 63323080612

## 2017-11-13 MED ADMIN — FENTANYL CITRATE (PF) 50 MCG/ML IJ SOLN [3037]: 50 ug | INTRAVENOUS | @ 18:00:00 | Stop: 2017-11-13 | NDC 63323080612

## 2017-11-13 MED ADMIN — MIDAZOLAM 1 MG/ML IJ SOLN [10607]: 4 mg | INTRAVENOUS | @ 18:00:00 | Stop: 2017-11-13 | NDC 00409230521

## 2017-11-14 ENCOUNTER — Encounter: Admit: 2017-11-14 | Discharge: 2017-11-14 | Payer: MEDICARE

## 2017-11-14 DIAGNOSIS — I739 Peripheral vascular disease, unspecified: Principal | ICD-10-CM

## 2017-11-14 LAB — POC GLUCOSE
Lab: 48 mg/dL — CL (ref 70–100)
Lab: 48 mg/dL — CL (ref 70–100)
Lab: 136 mg/dL — ABNORMAL HIGH (ref 70–100)
Lab: 78 mg/dL (ref 70–100)
Lab: 79 mg/dL (ref 70–100)
Lab: 162 mg/dL — ABNORMAL HIGH (ref 70–100)

## 2017-11-14 LAB — ANTI-MITOCHONDRIAL ANTIBODY: Lab: <20 {titer} (ref <20)

## 2017-11-14 LAB — CBC
Lab: 9.8 10*3/uL (ref 4.5–11.0)
Lab: 5.1 K/UL — ABNORMAL LOW (ref >60)
Lab: 13 % (ref 11–15)
Lab: 27 % — ABNORMAL LOW (ref 40–50)
Lab: 3 M/UL — ABNORMAL LOW (ref 4.4–5.5)
Lab: 30 pg (ref 26–34)
Lab: 34 g/dL — ABNORMAL HIGH (ref 32.0–36.0)
Lab: 5 10*3/uL (ref 4.5–11.0)
Lab: 8.1 FL (ref 7–11)
Lab: 89 FL (ref 80–100)
Lab: 9.4 g/dL — ABNORMAL LOW (ref 13.5–16.5)
Lab: 97 10*3/uL — ABNORMAL LOW (ref 150–400)

## 2017-11-14 LAB — MAGNESIUM: Lab: 2 mg/dL — ABNORMAL HIGH (ref 1.6–2.6)

## 2017-11-14 LAB — PHOSPHORUS: Lab: 5.4 mg/dL — ABNORMAL HIGH (ref 2.0–4.5)

## 2017-11-14 LAB — COMPREHENSIVE METABOLIC PANEL: Lab: 139 MMOL/L — ABNORMAL LOW (ref >60)

## 2017-11-14 LAB — ANTI-SMOOTH MUSCLE AB

## 2017-11-14 LAB — ANTI-SMOOTH MUSCLE-QUANT: Lab: 160 {titer} — ABNORMAL HIGH (ref <20)

## 2017-11-14 MED ORDER — HYDROMORPHONE (PF) 2 MG/ML IJ SYRG
.5 mg | Freq: Once | INTRAVENOUS | 0 refills | Status: DC
Start: 2017-11-14 — End: 2017-11-14

## 2017-11-14 MED ORDER — AMOXICILLIN-POT CLAVULANATE 500-125 MG PO TAB
500 mg | Freq: Two times a day (BID) | ORAL | 0 refills | Status: DC
Start: 2017-11-14 — End: 2017-11-16
  Administered 2017-11-14 – 2017-11-15 (×3): 500 mg via ORAL

## 2017-11-14 MED ORDER — CARVEDILOL 3.125 MG PO TAB
3.125 mg | Freq: Two times a day (BID) | ORAL | 0 refills | Status: DC
Start: 2017-11-14 — End: 2017-11-15
  Administered 2017-11-14 – 2017-11-15 (×2): 3.125 mg via ORAL

## 2017-11-14 MED ORDER — PANTOPRAZOLE 40 MG IV SOLR
40 mg | Freq: Two times a day (BID) | INTRAVENOUS | 0 refills | Status: DC
Start: 2017-11-14 — End: 2017-11-18
  Administered 2017-11-14 – 2017-11-18 (×8): 40 mg via INTRAVENOUS

## 2017-11-14 MED ORDER — HYDROMORPHONE (PF) 2 MG/ML IJ SYRG
.5 mg | Freq: Once | INTRAVENOUS | 0 refills | Status: CP
Start: 2017-11-14 — End: ?
  Administered 2017-11-14: 10:00:00 0.5 mg via INTRAVENOUS

## 2017-11-14 MED ORDER — GABAPENTIN 100 MG PO CAP
100 mg | ORAL | 0 refills | Status: DC
Start: 2017-11-14 — End: 2017-11-18
  Administered 2017-11-14 – 2017-11-18 (×12): 100 mg via ORAL

## 2017-11-14 MED ORDER — OXYCODONE 5 MG PO TAB
5-15 mg | ORAL | 0 refills | Status: DC | PRN
Start: 2017-11-14 — End: 2017-11-25
  Administered 2017-11-14: 23:00:00 10 mg via ORAL
  Administered 2017-11-14: 19:00:00 5 mg via ORAL
  Administered 2017-11-15: 01:00:00 15 mg via ORAL
  Administered 2017-11-15: 05:00:00 10 mg via ORAL
  Administered 2017-11-16: 5 mg via ORAL
  Administered 2017-11-16: 12:00:00 10 mg via ORAL
  Administered 2017-11-17 – 2017-11-18 (×2): 5 mg via ORAL
  Administered 2017-11-18 – 2017-11-19 (×2): 10 mg via ORAL
  Administered 2017-11-19 (×3): 5 mg via ORAL
  Administered 2017-11-19: 01:00:00 10 mg via ORAL
  Administered 2017-11-20: 17:00:00 5 mg via ORAL
  Administered 2017-11-20 (×3): 10 mg via ORAL
  Administered 2017-11-21: 11:00:00 5 mg via ORAL
  Administered 2017-11-21: 18:00:00 10 mg via ORAL
  Administered 2017-11-21 – 2017-11-25 (×8): 5 mg via ORAL

## 2017-11-14 MED ORDER — AMOXICILLIN-POT CLAVULANATE 875-125 MG PO TAB
875 mg | Freq: Two times a day (BID) | ORAL | 0 refills | Status: DC
Start: 2017-11-14 — End: 2017-11-14

## 2017-11-14 MED ADMIN — DEXTROSE 50 % IN WATER (D50W) IV SYRG [2365]: 50 mL | INTRAVENOUS | @ 03:00:00 | Stop: 2017-11-14 | NDC 76329330101

## 2017-11-15 ENCOUNTER — Encounter: Admit: 2017-11-15 | Discharge: 2017-11-15 | Payer: MEDICARE

## 2017-11-15 DIAGNOSIS — F17200 Nicotine dependence, unspecified, uncomplicated: ICD-10-CM

## 2017-11-15 DIAGNOSIS — K859 Acute pancreatitis without necrosis or infection, unspecified: ICD-10-CM

## 2017-11-15 DIAGNOSIS — R0600 Dyspnea, unspecified: ICD-10-CM

## 2017-11-15 DIAGNOSIS — E119 Type 2 diabetes mellitus without complications: ICD-10-CM

## 2017-11-15 DIAGNOSIS — E785 Hyperlipidemia, unspecified: ICD-10-CM

## 2017-11-15 DIAGNOSIS — I10 Essential (primary) hypertension: Principal | ICD-10-CM

## 2017-11-15 LAB — CBC
Lab: 2.4 M/UL — ABNORMAL LOW (ref 4.4–5.5)
Lab: 21 % — ABNORMAL LOW (ref 40–50)
Lab: 30 pg — ABNORMAL LOW (ref 26–34)
Lab: 4.6 K/UL — ABNORMAL LOW (ref 4.5–11.0)
Lab: 7.3 g/dL — ABNORMAL LOW (ref 13.5–16.5)
Lab: 88 FL — ABNORMAL HIGH (ref 80–100)
Lab: 3.7 K/UL — ABNORMAL LOW (ref >60)
Lab: 1.9 M/UL — ABNORMAL LOW (ref 4.4–5.5)
Lab: 17 % — ABNORMAL LOW (ref 40–50)
Lab: 2.6 10*3/uL — ABNORMAL LOW (ref 4.5–11.0)
Lab: 30 pg (ref 26–34)
Lab: 34 g/dL — ABNORMAL LOW (ref 32.0–36.0)
Lab: 6 g/dL — ABNORMAL LOW (ref 13.5–16.5)
Lab: 89 FL — ABNORMAL HIGH (ref 80–100)
Lab: 30 pg — ABNORMAL HIGH (ref 26–34)
Lab: 4.5 10*3/uL (ref 4.5–11.0)
Lab: 8 FL (ref >60)

## 2017-11-15 LAB — POC GLUCOSE
Lab: 176 mg/dL — ABNORMAL HIGH (ref 70–100)
Lab: 217 mg/dL — ABNORMAL HIGH (ref 70–100)
Lab: 223 mg/dL — ABNORMAL HIGH (ref 70–100)
Lab: 356 mg/dL — ABNORMAL HIGH (ref 70–100)

## 2017-11-15 LAB — COMPREHENSIVE METABOLIC PANEL: Lab: 135 MMOL/L — ABNORMAL LOW (ref >60)

## 2017-11-15 LAB — IRON + BINDING CAPACITY + %SAT+ FERRITIN
Lab: 47 % — ABNORMAL HIGH (ref 28–42)
Lab: 542 ng/mL — ABNORMAL HIGH (ref 30–300)
Lab: 99 ug/dL — ABNORMAL LOW (ref 50–185)

## 2017-11-15 LAB — MAGNESIUM: Lab: 2 mg/dL — ABNORMAL LOW (ref 1.6–2.6)

## 2017-11-15 LAB — TRANSFERRIN: Lab: 141 mg/dL — ABNORMAL LOW (ref 185–336)

## 2017-11-15 LAB — PHOSPHORUS: Lab: 4.2 mg/dL — ABNORMAL HIGH (ref 2.0–4.5)

## 2017-11-15 MED ORDER — PNEUMOC 13-VAL CONJ-DIP CR(PF) 0.5 ML IM SYRG
.5 mL | Freq: Once | INTRAMUSCULAR | 0 refills | Status: DC
Start: 2017-11-15 — End: 2017-11-15

## 2017-11-15 MED ORDER — GADOBENATE DIMEGLUMINE 529 MG/ML (0.1MMOL/0.2ML) IV SOLN
16 mL | Freq: Once | INTRAVENOUS | 0 refills | Status: CP
Start: 2017-11-15 — End: ?
  Administered 2017-11-15: 07:00:00 16 mL via INTRAVENOUS

## 2017-11-15 MED ORDER — MENIN C,Y,W-135 VAC,2 OF 2(PF) 5 MCG X 3/ 0.5 ML (FINAL) IM SOLR
1 | Freq: Once | INTRAMUSCULAR | 0 refills | Status: DC
Start: 2017-11-15 — End: 2017-11-27

## 2017-11-15 MED ORDER — MENING A CONJ VACC,1 OF 2 (PF) 10 MCG /0.5 ML (FINAL) IM SOLR
.5 mL | Freq: Once | INTRAMUSCULAR | 0 refills | Status: CN
Start: 2017-11-15 — End: ?

## 2017-11-15 MED ORDER — CARVEDILOL 12.5 MG PO TAB
12.5 mg | Freq: Two times a day (BID) | ORAL | 0 refills | Status: DC
Start: 2017-11-15 — End: 2017-11-16
  Administered 2017-11-16: 02:00:00 12.5 mg via ORAL

## 2017-11-15 MED ORDER — CARVEDILOL 3.125 MG PO TAB
6.25 mg | Freq: Once | ORAL | 0 refills | Status: CP
Start: 2017-11-15 — End: ?
  Administered 2017-11-15: 17:00:00 6.25 mg via ORAL

## 2017-11-15 MED ORDER — CITALOPRAM 20 MG PO TAB
40 mg | Freq: Every day | ORAL | 0 refills | Status: DC
Start: 2017-11-15 — End: 2017-11-28
  Administered 2017-11-15 – 2017-11-28 (×14): 40 mg via ORAL

## 2017-11-15 MED ORDER — CARVEDILOL 12.5 MG PO TAB
12.5 mg | Freq: Two times a day (BID) | ORAL | 0 refills | Status: DC
Start: 2017-11-15 — End: 2017-11-15

## 2017-11-15 MED ORDER — MENINGOCOCCAL B VACCINE,4-COMP 50-50-50-25 MCG/0.5 ML IM SYRG
.5 mL | Freq: Once | INTRAMUSCULAR | 0 refills | Status: DC
Start: 2017-11-15 — End: 2017-11-27

## 2017-11-15 MED ORDER — HIB CONJ VACC (ACTHIB) DUAL COMPONENT INJ
Freq: Once | INTRAMUSCULAR | 0 refills | Status: DC
Start: 2017-11-15 — End: 2017-11-15

## 2017-11-15 MED ORDER — CARVEDILOL 3.125 MG PO TAB
6.25 mg | Freq: Two times a day (BID) | ORAL | 0 refills | Status: DC
Start: 2017-11-15 — End: 2017-11-15
  Administered 2017-11-15: 12:00:00 6.25 mg via ORAL

## 2017-11-15 MED ORDER — MENINGOCOCCAL B VACCINE,4-COMP 50-50-50-25 MCG/0.5 ML IM SYRG
.5 mL | Freq: Once | INTRAMUSCULAR | 0 refills | Status: CN
Start: 2017-11-15 — End: ?

## 2017-11-15 MED ORDER — PNEUMOC 13-VAL CONJ-DIP CR(PF) 0.5 ML IM SYRG
.5 mL | Freq: Once | INTRAMUSCULAR | 0 refills | Status: CN
Start: 2017-11-15 — End: ?

## 2017-11-15 MED ORDER — HIB CONJ VACC (ACTHIB) DUAL COMPONENT INJ
Freq: Once | INTRAMUSCULAR | 0 refills | Status: DC
Start: 2017-11-15 — End: 2017-11-27

## 2017-11-15 MED ORDER — LOSARTAN 50 MG PO TAB
100 mg | Freq: Every day | ORAL | 0 refills | Status: DC
Start: 2017-11-15 — End: 2017-11-16
  Administered 2017-11-16: 04:00:00 100 mg via ORAL

## 2017-11-15 MED ORDER — HIB CONJ VACC (ACTHIB) DUAL COMPONENT INJ
Freq: Once | INTRAMUSCULAR | 0 refills | Status: CN
Start: 2017-11-15 — End: ?

## 2017-11-15 MED ORDER — HYDROMORPHONE (PF) 2 MG/ML IJ SYRG
.5 mg | INTRAVENOUS | 0 refills | Status: DC | PRN
Start: 2017-11-15 — End: 2017-11-16
  Administered 2017-11-15: 21:00:00 0.5 mg via INTRAVENOUS

## 2017-11-15 MED ORDER — AMLODIPINE 10 MG PO TAB
10 mg | Freq: Every morning | ORAL | 0 refills | Status: DC
Start: 2017-11-15 — End: 2017-11-28
  Administered 2017-11-16 – 2017-11-28 (×12): 10 mg via ORAL

## 2017-11-15 MED ORDER — CARVEDILOL 3.125 MG PO TAB
6.25 mg | Freq: Two times a day (BID) | ORAL | 0 refills | Status: DC
Start: 2017-11-15 — End: 2017-11-15

## 2017-11-15 MED ORDER — MENING A CONJ VACC,1 OF 2 (PF) 10 MCG /0.5 ML (FINAL) IM SOLR
.5 mL | Freq: Once | INTRAMUSCULAR | 0 refills | Status: DC
Start: 2017-11-15 — End: 2017-11-15

## 2017-11-15 MED ORDER — PNEUMOC 13-VAL CONJ-DIP CR(PF) 0.5 ML IM SYRG
.5 mL | Freq: Once | INTRAMUSCULAR | 0 refills | Status: DC
Start: 2017-11-15 — End: 2017-11-27

## 2017-11-15 MED ORDER — MENIN C,Y,W-135 VAC,2 OF 2(PF) 5 MCG X 3/ 0.5 ML (FINAL) IM SOLR
1 | Freq: Once | INTRAMUSCULAR | 0 refills | Status: DC
Start: 2017-11-15 — End: 2017-11-15

## 2017-11-15 MED ORDER — MENINGOCOCCAL B VACCINE,4-COMP 50-50-50-25 MCG/0.5 ML IM SYRG
.5 mL | Freq: Once | INTRAMUSCULAR | 0 refills | Status: DC
Start: 2017-11-15 — End: 2017-11-15

## 2017-11-15 MED ORDER — LOSARTAN 50 MG PO TAB
100 mg | Freq: Every day | ORAL | 0 refills | Status: DC
Start: 2017-11-15 — End: 2017-11-16

## 2017-11-15 MED ORDER — CARVEDILOL 3.125 MG PO TAB
6.25 mg | Freq: Once | ORAL | 0 refills | Status: DC
Start: 2017-11-15 — End: 2017-11-15

## 2017-11-15 MED ORDER — MENIN C,Y,W-135 VAC,2 OF 2(PF) 5 MCG X 3/ 0.5 ML (FINAL) IM SOLR
1 | Freq: Once | INTRAMUSCULAR | 0 refills | Status: CN
Start: 2017-11-15 — End: ?

## 2017-11-15 MED ORDER — MENING A CONJ VACC,1 OF 2 (PF) 10 MCG /0.5 ML (FINAL) IM SOLR
.5 mL | Freq: Once | INTRAMUSCULAR | 0 refills | Status: DC
Start: 2017-11-15 — End: 2017-11-27

## 2017-11-16 ENCOUNTER — Inpatient Hospital Stay: Admit: 2017-11-16 | Discharge: 2017-11-16 | Payer: MEDICARE

## 2017-11-16 ENCOUNTER — Encounter: Admit: 2017-11-16 | Discharge: 2017-11-16 | Payer: MEDICARE

## 2017-11-16 DIAGNOSIS — E119 Type 2 diabetes mellitus without complications: ICD-10-CM

## 2017-11-16 DIAGNOSIS — K859 Acute pancreatitis without necrosis or infection, unspecified: ICD-10-CM

## 2017-11-16 DIAGNOSIS — L97521 Non-pressure chronic ulcer of other part of left foot limited to breakdown of skin: Secondary | ICD-10-CM

## 2017-11-16 DIAGNOSIS — R0600 Dyspnea, unspecified: ICD-10-CM

## 2017-11-16 DIAGNOSIS — E785 Hyperlipidemia, unspecified: ICD-10-CM

## 2017-11-16 DIAGNOSIS — F17200 Nicotine dependence, unspecified, uncomplicated: ICD-10-CM

## 2017-11-16 DIAGNOSIS — I10 Essential (primary) hypertension: Principal | ICD-10-CM

## 2017-11-16 LAB — COMPREHENSIVE METABOLIC PANEL: Lab: 136 MMOL/L — ABNORMAL LOW (ref >60)

## 2017-11-16 LAB — MAGNESIUM: Lab: 2.2 mg/dL — ABNORMAL HIGH (ref 1.6–2.6)

## 2017-11-16 LAB — POC GLUCOSE
Lab: 262 mg/dL — ABNORMAL HIGH (ref 70–100)
Lab: 208 mg/dL — ABNORMAL HIGH (ref 70–100)
Lab: 185 mg/dL — ABNORMAL HIGH (ref 70–100)
Lab: 171 mg/dL — ABNORMAL HIGH (ref 70–100)
Lab: 167 mg/dL — ABNORMAL HIGH (ref 70–100)

## 2017-11-16 LAB — CBC
Lab: 4 K/UL — ABNORMAL LOW (ref 4.5–11.0)
Lab: 8.3 10*3/uL (ref 4.5–11.0)

## 2017-11-16 LAB — BASIC METABOLIC PANEL: Lab: 137 MMOL/L — ABNORMAL LOW (ref 137–147)

## 2017-11-16 LAB — PHOSPHORUS: Lab: 3.6 mg/dL — ABNORMAL LOW (ref 2.0–4.5)

## 2017-11-16 MED ORDER — PROPOFOL 10 MG/ML IV EMUL (INFUSION)(AM)(OR)
0 refills | Status: DC
Start: 2017-11-16 — End: 2017-11-16
  Administered 2017-11-16: 13:00:00 80 ug/kg/min via INTRAVENOUS

## 2017-11-16 MED ORDER — SODIUM CHLORIDE 0.9 % IV SOLP
0 refills | Status: DC
Start: 2017-11-16 — End: 2017-11-16

## 2017-11-16 MED ORDER — IOPAMIDOL 61 % IV SOLN
100 mL | Freq: Once | INTRA_ARTERIAL | 0 refills | Status: CP
Start: 2017-11-16 — End: ?
  Administered 2017-11-16: 18:00:00 100 mL via INTRA_ARTERIAL

## 2017-11-16 MED ORDER — CARVEDILOL 25 MG PO TAB
25 mg | Freq: Two times a day (BID) | ORAL | 0 refills | Status: DC
Start: 2017-11-16 — End: 2017-11-28
  Administered 2017-11-17 – 2017-11-28 (×22): 25 mg via ORAL

## 2017-11-16 MED ORDER — PIPERACILLIN/TAZOBACTAM 3.375 G/NS IVPB (MB+)
3.375 g | INTRAVENOUS | 0 refills | Status: CP
Start: 2017-11-16 — End: ?
  Administered 2017-11-16 – 2017-11-21 (×40): 3.375 g via INTRAVENOUS

## 2017-11-16 MED ORDER — PROMETHAZINE 25 MG/ML IJ SOLN
6.25 mg | INTRAVENOUS | 0 refills | Status: DC | PRN
Start: 2017-11-16 — End: 2017-11-16

## 2017-11-16 MED ORDER — FENTANYL CITRATE (PF) 50 MCG/ML IJ SOLN
0 refills | Status: DC
Start: 2017-11-16 — End: 2017-11-16
  Administered 2017-11-16 (×2): 25 ug via INTRAVENOUS

## 2017-11-16 MED ORDER — HYDROMORPHONE (PF) 2 MG/ML IJ SYRG
.5 mg | INTRAVENOUS | 0 refills | Status: DC | PRN
Start: 2017-11-16 — End: 2017-11-18
  Administered 2017-11-16: 22:00:00 0.5 mg via INTRAVENOUS

## 2017-11-16 MED ORDER — LIDOCAINE (PF) 200 MG/10 ML (2 %) IJ SYRG
0 refills | Status: DC
Start: 2017-11-16 — End: 2017-11-16
  Administered 2017-11-16: 13:00:00 75 mg via INTRAVENOUS

## 2017-11-16 MED ORDER — FENTANYL CITRATE (PF) 50 MCG/ML IJ SOLN
25-50 ug | INTRAVENOUS | 0 refills | Status: DC | PRN
Start: 2017-11-16 — End: 2017-11-16

## 2017-11-16 MED ORDER — HALOPERIDOL LACTATE 5 MG/ML IJ SOLN
1 mg | Freq: Once | INTRAVENOUS | 0 refills | Status: DC | PRN
Start: 2017-11-16 — End: 2017-11-16

## 2017-11-16 MED ORDER — OXYCODONE 5 MG PO TAB
5-10 mg | Freq: Once | ORAL | 0 refills | Status: DC | PRN
Start: 2017-11-16 — End: 2017-11-16

## 2017-11-16 MED ORDER — HEPARIN (PORCINE) 1,000 UNIT/ML IJ SOLN
3000 [IU] | Freq: Once | 0 refills | Status: CP
Start: 2017-11-16 — End: ?
  Administered 2017-11-16: 14:00:00 3000 [IU]

## 2017-11-16 MED ORDER — HYDRALAZINE 20 MG/ML IJ SOLN
10 mg | INTRAVENOUS | 0 refills | Status: DC | PRN
Start: 2017-11-16 — End: 2017-11-28
  Administered 2017-11-16: 21:00:00 10 mg via INTRAVENOUS

## 2017-11-16 MED ORDER — NITROGLYCERIN(#) INJ 100MCG/ML IJ SOLN
0 refills | Status: CP
Start: 2017-11-16 — End: ?
  Administered 2017-11-16 (×2): 200 ug via INTRA_ARTERIAL

## 2017-11-16 MED ORDER — GENTAMICIN IVPB (EXTENDED INTERVAL)
260 mg | Freq: Once | INTRAVENOUS | 0 refills | Status: CP
Start: 2017-11-16 — End: ?
  Administered 2017-11-16 (×2): 260 mg via INTRAVENOUS

## 2017-11-16 MED ORDER — LACTATED RINGERS IV SOLP
250 mL | INTRAVENOUS | 0 refills | Status: DC
Start: 2017-11-16 — End: 2017-11-16

## 2017-11-16 MED ORDER — CEFOXITIN 1 GRAM IV SOLR
0 refills | Status: DC
Start: 2017-11-16 — End: 2017-11-16
  Administered 2017-11-16: 14:00:00 1 g via INTRAVENOUS

## 2017-11-16 MED ORDER — VERAPAMIL 2.5 MG/ML IV SOLN
0 refills | Status: CP
Start: 2017-11-16 — End: ?
  Administered 2017-11-16: 14:00:00 2.5 mg via INTRAVENOUS

## 2017-11-16 MED ORDER — DIPHENHYDRAMINE HCL 50 MG/ML IJ SOLN
25 mg | Freq: Once | INTRAVENOUS | 0 refills | Status: DC | PRN
Start: 2017-11-16 — End: 2017-11-16

## 2017-11-17 LAB — PHOSPHORUS: Lab: 2.8 mg/dL (ref 2.0–4.5)

## 2017-11-17 LAB — POC GLUCOSE
Lab: 185 mg/dL — ABNORMAL HIGH (ref 70–100)
Lab: 183 mg/dL — ABNORMAL HIGH (ref 70–100)
Lab: 263 mg/dL — ABNORMAL HIGH (ref 70–100)
Lab: 270 mg/dL — ABNORMAL HIGH (ref 70–100)

## 2017-11-17 LAB — COMPREHENSIVE METABOLIC PANEL: Lab: 132 MMOL/L — ABNORMAL LOW (ref >60)

## 2017-11-17 LAB — BASIC METABOLIC PANEL
Lab: 104 MMOL/L (ref >60)
Lab: 133 MMOL/L — ABNORMAL LOW (ref 137–147)
Lab: 2.4 mg/dL — ABNORMAL HIGH (ref 0.4–1.24)
Lab: 22 MMOL/L (ref 21–30)
Lab: 26 mL/min — ABNORMAL LOW (ref >60)
Lab: 279 mg/dL — ABNORMAL HIGH (ref 70–100)
Lab: 32 mL/min — ABNORMAL LOW (ref >60)
Lab: 32 mg/dL — ABNORMAL HIGH (ref 7–25)
Lab: 4.7 MMOL/L (ref >60)
Lab: 7.3 mg/dL — ABNORMAL LOW (ref 8.5–10.6)
Lab: 7 (ref 3–12)

## 2017-11-17 LAB — CBC
Lab: 9.7 K/UL — ABNORMAL HIGH (ref 4.5–11.0)
Lab: 7.8 K/UL — ABNORMAL HIGH (ref 4.5–11.0)

## 2017-11-17 LAB — MAGNESIUM: Lab: 2.1 mg/dL — ABNORMAL HIGH (ref >60)

## 2017-11-17 MED ORDER — INSULIN ASPART 100 UNIT/ML SC FLEXPEN
0-12 [IU] | Freq: Before meals | SUBCUTANEOUS | 0 refills | Status: DC
Start: 2017-11-17 — End: 2017-11-25

## 2017-11-17 MED ORDER — INSULIN GLARGINE 100 UNIT/ML (3 ML) SC INJ PEN
15 [IU] | Freq: Every evening | SUBCUTANEOUS | 0 refills | Status: DC
Start: 2017-11-17 — End: 2017-11-18
  Administered 2017-11-17: 22:00:00 15 [IU] via SUBCUTANEOUS

## 2017-11-18 LAB — COMPREHENSIVE METABOLIC PANEL: Lab: 136 MMOL/L — ABNORMAL LOW (ref 137–147)

## 2017-11-18 LAB — POC GLUCOSE
Lab: 205 mg/dL — ABNORMAL HIGH (ref 70–100)
Lab: 114 mg/dL — ABNORMAL HIGH (ref 70–100)
Lab: 137 mg/dL — ABNORMAL HIGH (ref 70–100)
Lab: 230 mg/dL — ABNORMAL HIGH (ref 70–100)
Lab: 222 mg/dL — ABNORMAL HIGH (ref 70–100)

## 2017-11-18 LAB — CBC
Lab: 10 10*3/uL — ABNORMAL HIGH (ref 4.5–11.0)
Lab: 9.2 K/UL — ABNORMAL LOW (ref 4.5–11.0)

## 2017-11-18 LAB — MAGNESIUM: Lab: 2 mg/dL — ABNORMAL HIGH (ref >60)

## 2017-11-18 LAB — PHOSPHORUS: Lab: 2.1 mg/dL — ABNORMAL HIGH (ref >60)

## 2017-11-18 MED ORDER — GABAPENTIN 100 MG PO CAP
100 mg | Freq: Every evening | ORAL | 0 refills | Status: DC
Start: 2017-11-18 — End: 2017-11-25
  Administered 2017-11-19 – 2017-11-25 (×7): 100 mg via ORAL

## 2017-11-18 MED ORDER — INSULIN GLARGINE 100 UNIT/ML (3 ML) SC INJ PEN
20 [IU] | Freq: Every evening | SUBCUTANEOUS | 0 refills | Status: DC
Start: 2017-11-18 — End: 2017-11-19
  Administered 2017-11-19: 02:00:00 20 [IU] via SUBCUTANEOUS

## 2017-11-18 MED ORDER — LACTULOSE 10 GRAM/15 ML PO SOLN
30 mL | Freq: Every day | ORAL | 0 refills | Status: DC
Start: 2017-11-18 — End: 2017-11-28
  Administered 2017-11-23 – 2017-11-28 (×5): 20 g via ORAL

## 2017-11-18 MED ORDER — FUROSEMIDE 40 MG PO TAB
40 mg | Freq: Every morning | ORAL | 0 refills | Status: DC
Start: 2017-11-18 — End: 2017-11-28
  Administered 2017-11-18 – 2017-11-28 (×11): 40 mg via ORAL

## 2017-11-18 MED ORDER — PANTOPRAZOLE 40 MG PO TBEC
40 mg | Freq: Two times a day (BID) | ORAL | 0 refills | Status: DC
Start: 2017-11-18 — End: 2017-11-28
  Administered 2017-11-19 – 2017-11-28 (×19): 40 mg via ORAL

## 2017-11-19 ENCOUNTER — Encounter: Admit: 2017-11-19 | Discharge: 2017-11-19 | Payer: MEDICARE

## 2017-11-19 LAB — POC GLUCOSE
Lab: 306 mg/dL — ABNORMAL HIGH (ref 70–100)
Lab: 118 mg/dL — ABNORMAL HIGH (ref 70–100)
Lab: 179 mg/dL — ABNORMAL HIGH (ref 70–100)
Lab: 254 mg/dL — ABNORMAL HIGH (ref 70–100)

## 2017-11-19 LAB — MAGNESIUM: Lab: 2.1 mg/dL — ABNORMAL LOW (ref 1.6–2.6)

## 2017-11-19 LAB — CBC
Lab: 10 K/UL — ABNORMAL HIGH (ref 4.5–11.0)
Lab: 11 K/UL — ABNORMAL HIGH (ref 4.5–11.0)

## 2017-11-19 LAB — PHOSPHORUS: Lab: 2.3 mg/dL — ABNORMAL HIGH (ref >60)

## 2017-11-19 LAB — COMPREHENSIVE METABOLIC PANEL: Lab: 136 MMOL/L — ABNORMAL LOW (ref >60)

## 2017-11-19 MED ORDER — RIFAXIMIN 550 MG PO TAB
550 mg | ORAL_TABLET | Freq: Two times a day (BID) | ORAL | 11 refills | 30.00000 days | Status: AC
Start: 2017-11-19 — End: 2017-11-20

## 2017-11-19 MED ORDER — INSULIN GLARGINE 100 UNIT/ML (3 ML) SC INJ PEN
25 [IU] | Freq: Every evening | SUBCUTANEOUS | 0 refills | Status: DC
Start: 2017-11-19 — End: 2017-11-22

## 2017-11-20 ENCOUNTER — Encounter: Admit: 2017-11-20 | Discharge: 2017-11-20 | Payer: MEDICARE

## 2017-11-20 LAB — COMPREHENSIVE METABOLIC PANEL
Lab: 0.2 mg/dL — ABNORMAL LOW (ref 0.3–1.2)
Lab: 106 MMOL/L (ref 98–110)
Lab: 120 mg/dL — ABNORMAL HIGH (ref 70–100)
Lab: 134 MMOL/L — ABNORMAL LOW (ref 137–147)
Lab: 2.1 g/dL — ABNORMAL LOW (ref 3.5–5.0)
Lab: 2.6 mg/dL — ABNORMAL HIGH (ref 0.4–1.24)
Lab: 24 mL/min — ABNORMAL LOW (ref >60)
Lab: 26 mg/dL — ABNORMAL HIGH (ref 7–25)
Lab: 29 mL/min — ABNORMAL LOW (ref >60)
Lab: 3.8 MMOL/L (ref 3.5–5.1)
Lab: 4.7 g/dL — ABNORMAL LOW (ref 6.0–8.0)
Lab: 7.5 mg/dL — ABNORMAL LOW (ref 8.5–10.6)
Lab: 71 U/L (ref 25–110)
Lab: 9 U/L (ref 7–56)
Lab: 6 (ref 3–12)

## 2017-11-20 LAB — POC GLUCOSE
Lab: 252 mg/dL — ABNORMAL HIGH (ref 70–100)
Lab: 71 mg/dL (ref 70–100)
Lab: 113 mg/dL — ABNORMAL HIGH (ref 70–100)
Lab: 178 mg/dL — ABNORMAL HIGH (ref 70–100)

## 2017-11-20 LAB — CBC
Lab: 10 K/UL — ABNORMAL LOW (ref >60)
Lab: 12 10*3/uL — ABNORMAL HIGH (ref 4.5–11.0)
Lab: 13 % (ref 11–15)
Lab: 2.8 M/UL — ABNORMAL LOW (ref 4.4–5.5)
Lab: 201 10*3/uL (ref 150–400)
Lab: 25 % — ABNORMAL LOW (ref 40–50)
Lab: 29 pg (ref 26–34)
Lab: 33 g/dL (ref 32.0–36.0)
Lab: 8.5 g/dL — ABNORMAL LOW (ref 13.5–16.5)
Lab: 8.9 FL (ref 7–11)
Lab: 87 FL (ref 80–100)

## 2017-11-20 MED ORDER — POTASSIUM CHLORIDE 20 MEQ PO TBTQ
20 meq | Freq: Once | ORAL | 0 refills | Status: CP
Start: 2017-11-20 — End: ?
  Administered 2017-11-20: 19:00:00 20 meq via ORAL

## 2017-11-20 MED ORDER — SODIUM CHLORIDE 0.9 % IV SOLP
INTRAVENOUS | 0 refills | Status: CN
Start: 2017-11-20 — End: ?

## 2017-11-20 MED ORDER — MELATONIN 5 MG PO TAB
5 mg | Freq: Every evening | ORAL | 0 refills | Status: DC
Start: 2017-11-20 — End: 2017-11-28
  Administered 2017-11-21 – 2017-11-28 (×8): 5 mg via ORAL

## 2017-11-20 MED ORDER — RIFAXIMIN 550 MG PO TAB
550 mg | ORAL_TABLET | Freq: Two times a day (BID) | ORAL | 3 refills | 30.00000 days | Status: AC
Start: 2017-11-20 — End: 2017-11-23
  Filled 2017-11-20: qty 60, 30d supply

## 2017-11-21 ENCOUNTER — Encounter: Admit: 2017-11-21 | Discharge: 2017-11-21 | Payer: MEDICARE

## 2017-11-21 ENCOUNTER — Encounter: Admit: 2017-11-21 | Discharge: 2017-11-22 | Payer: MEDICARE

## 2017-11-21 DIAGNOSIS — L97521 Non-pressure chronic ulcer of other part of left foot limited to breakdown of skin: Secondary | ICD-10-CM

## 2017-11-21 LAB — CBC
Lab: 11 10*3/uL — ABNORMAL HIGH (ref 4.5–11.0)
Lab: 16 K/UL — ABNORMAL HIGH (ref 4.5–11.0)
Lab: 240 K/UL (ref >60)
Lab: 27 % — ABNORMAL LOW (ref 40–50)
Lab: 29 pg (ref 26–34)
Lab: 3 M/UL — ABNORMAL LOW (ref 4.4–5.5)
Lab: 32 g/dL — ABNORMAL HIGH (ref 32.0–36.0)
Lab: 8.9 g/dL — ABNORMAL LOW (ref 13.5–16.5)

## 2017-11-21 LAB — POC GLUCOSE
Lab: 474 mg/dL — ABNORMAL HIGH (ref 70–100)
Lab: 204 mg/dL — ABNORMAL HIGH (ref 70–100)
Lab: 146 mg/dL — ABNORMAL HIGH (ref 70–100)
Lab: 100 mg/dL (ref 70–100)
Lab: 85 mg/dL (ref 70–100)

## 2017-11-21 LAB — COMPREHENSIVE METABOLIC PANEL: Lab: 135 MMOL/L — ABNORMAL LOW (ref >60)

## 2017-11-21 LAB — MAGNESIUM: Lab: 2 mg/dL — ABNORMAL HIGH (ref >60)

## 2017-11-21 LAB — PHOSPHORUS: Lab: 2.7 mg/dL — ABNORMAL HIGH (ref 2.0–4.5)

## 2017-11-21 MED ORDER — PROPOFOL INJ 10 MG/ML IV VIAL
0 refills | Status: DC
Start: 2017-11-21 — End: 2017-11-22
  Administered 2017-11-21: 100 mg via INTRAVENOUS

## 2017-11-21 MED ORDER — SODIUM CHLORIDE 0.9 % IR SOLN
0 refills | Status: DC
Start: 2017-11-21 — End: 2017-11-22
  Administered 2017-11-22: 1000 mL

## 2017-11-21 MED ORDER — IOPAMIDOL 61 % IV SOLN
0 refills | Status: DC
Start: 2017-11-21 — End: 2017-11-22
  Administered 2017-11-22: 20 mL via INTRAMUSCULAR

## 2017-11-21 MED ORDER — DIPHENHYDRAMINE HCL 50 MG/ML IJ SOLN
25 mg | Freq: Once | INTRAVENOUS | 0 refills | Status: DC | PRN
Start: 2017-11-21 — End: 2017-11-22

## 2017-11-21 MED ORDER — ONDANSETRON HCL (PF) 4 MG/2 ML IJ SOLN
INTRAVENOUS | 0 refills | Status: DC
Start: 2017-11-21 — End: 2017-11-22
  Administered 2017-11-22: 4 mg via INTRAVENOUS

## 2017-11-21 MED ORDER — OXYCODONE 5 MG PO TAB
5-10 mg | Freq: Once | ORAL | 0 refills | Status: DC | PRN
Start: 2017-11-21 — End: 2017-11-22

## 2017-11-21 MED ORDER — DEXTRAN 70-HYPROMELLOSE (PF) 0.1-0.3 % OP DPET
0 refills | Status: DC
Start: 2017-11-21 — End: 2017-11-22
  Administered 2017-11-22: 1 [drp] via OPHTHALMIC

## 2017-11-21 MED ORDER — LACTATED RINGERS IV SOLP
250 mL | INTRAVENOUS | 0 refills | Status: DC
Start: 2017-11-21 — End: 2017-11-22

## 2017-11-21 MED ORDER — FENTANYL CITRATE (PF) 50 MCG/ML IJ SOLN
25-50 ug | INTRAVENOUS | 0 refills | Status: DC | PRN
Start: 2017-11-21 — End: 2017-11-22

## 2017-11-21 MED ORDER — LIDOCAINE (PF) 200 MG/10 ML (2 %) IJ SYRG
0 refills | Status: DC
Start: 2017-11-21 — End: 2017-11-22
  Administered 2017-11-21: 100 mg via INTRAVENOUS

## 2017-11-21 MED ORDER — HALOPERIDOL LACTATE 5 MG/ML IJ SOLN
1 mg | Freq: Once | INTRAVENOUS | 0 refills | Status: DC | PRN
Start: 2017-11-21 — End: 2017-11-22

## 2017-11-21 MED ORDER — FENTANYL CITRATE (PF) 50 MCG/ML IJ SOLN
0 refills | Status: DC
Start: 2017-11-21 — End: 2017-11-22
  Administered 2017-11-21: 100 ug via INTRAVENOUS

## 2017-11-21 MED ORDER — SODIUM CHLORIDE 0.9 % IV SOLP
0 refills | Status: DC
Start: 2017-11-21 — End: 2017-11-22
  Administered 2017-11-21: via INTRAVENOUS

## 2017-11-21 MED ORDER — FOLIC ACID 1 MG PO TAB
1 mg | Freq: Every day | ORAL | 0 refills | Status: DC
Start: 2017-11-21 — End: 2017-11-28
  Administered 2017-11-21 – 2017-11-28 (×8): 1 mg via ORAL

## 2017-11-21 MED ORDER — PROMETHAZINE 25 MG/ML IJ SOLN
6.25 mg | INTRAVENOUS | 0 refills | Status: DC | PRN
Start: 2017-11-21 — End: 2017-11-22

## 2017-11-21 MED ORDER — SUCCINYLCHOLINE CHLORIDE 20 MG/ML IJ SOLN
INTRAVENOUS | 0 refills | Status: DC
Start: 2017-11-21 — End: 2017-11-22
  Administered 2017-11-21: 100 mg via INTRAVENOUS

## 2017-11-22 ENCOUNTER — Encounter: Admit: 2017-11-22 | Discharge: 2017-11-22 | Payer: MEDICARE

## 2017-11-22 DIAGNOSIS — I10 Essential (primary) hypertension: Principal | ICD-10-CM

## 2017-11-22 DIAGNOSIS — E119 Type 2 diabetes mellitus without complications: ICD-10-CM

## 2017-11-22 DIAGNOSIS — F17200 Nicotine dependence, unspecified, uncomplicated: ICD-10-CM

## 2017-11-22 DIAGNOSIS — E785 Hyperlipidemia, unspecified: ICD-10-CM

## 2017-11-22 DIAGNOSIS — R0600 Dyspnea, unspecified: ICD-10-CM

## 2017-11-22 DIAGNOSIS — K859 Acute pancreatitis without necrosis or infection, unspecified: ICD-10-CM

## 2017-11-22 LAB — CBC
Lab: 14 K/UL — ABNORMAL HIGH (ref 4.5–11.0)
Lab: 10 10*3/uL (ref 4.5–11.0)
Lab: 14 % (ref 11–15)
Lab: 2.6 M/UL — ABNORMAL LOW (ref 4.4–5.5)
Lab: 23 % — ABNORMAL LOW (ref 40–50)
Lab: 277 10*3/uL (ref 150–400)
Lab: 30 pg (ref 26–34)
Lab: 34 g/dL (ref 32.0–36.0)
Lab: 8.1 g/dL — ABNORMAL LOW (ref 13.5–16.5)
Lab: 8.7 FL (ref 7–11)
Lab: 87 FL (ref 80–100)

## 2017-11-22 LAB — POC GLUCOSE
Lab: 67 mg/dL — ABNORMAL LOW (ref 70–100)
Lab: 73 mg/dL (ref 70–100)
Lab: 224 mg/dL — ABNORMAL HIGH (ref 70–100)
Lab: 216 mg/dL — ABNORMAL HIGH (ref 70–100)
Lab: 299 mg/dL — ABNORMAL HIGH (ref 70–100)

## 2017-11-22 LAB — PHOSPHORUS: Lab: 3.5 mg/dL — ABNORMAL LOW (ref 2.0–4.5)

## 2017-11-22 LAB — COMPREHENSIVE METABOLIC PANEL: Lab: 140 MMOL/L — ABNORMAL LOW (ref >60)

## 2017-11-22 LAB — MAGNESIUM: Lab: 2 mg/dL — ABNORMAL LOW (ref >60)

## 2017-11-22 MED ORDER — ASPIRIN 81 MG PO TBEC
81 mg | Freq: Every day | ORAL | 0 refills | Status: DC
Start: 2017-11-22 — End: 2017-11-28
  Administered 2017-11-22 – 2017-11-28 (×7): 81 mg via ORAL

## 2017-11-22 MED ORDER — INSULIN GLARGINE 100 UNIT/ML (3 ML) SC INJ PEN
30 [IU] | Freq: Every evening | SUBCUTANEOUS | 0 refills | Status: DC
Start: 2017-11-22 — End: 2017-11-26

## 2017-11-23 ENCOUNTER — Encounter: Admit: 2017-11-23 | Discharge: 2017-11-23 | Payer: MEDICARE

## 2017-11-23 LAB — CBC
Lab: 8.6 10*3/uL — ABNORMAL LOW (ref 4.5–11.0)
Lab: 11 10*3/uL — ABNORMAL HIGH (ref 4.5–11.0)
Lab: 14 % (ref 11–15)
Lab: 26 % — ABNORMAL LOW (ref 40–50)
Lab: 29 pg (ref 26–34)
Lab: 3 M/UL — ABNORMAL LOW (ref 4.4–5.5)
Lab: 33 g/dL (ref 32.0–36.0)
Lab: 355 10*3/uL (ref 150–400)
Lab: 8.6 FL (ref 7–11)
Lab: 8.7 g/dL — ABNORMAL LOW (ref 13.5–16.5)
Lab: 88 FL (ref 80–100)

## 2017-11-23 LAB — PHOSPHORUS: Lab: 2.6 mg/dL — ABNORMAL HIGH (ref >60)

## 2017-11-23 LAB — MAGNESIUM: Lab: 1.7 mg/dL — ABNORMAL LOW (ref >60)

## 2017-11-23 LAB — COMPREHENSIVE METABOLIC PANEL
Lab: 140 MMOL/L (ref >60)
Lab: 3.1 MMOL/L — ABNORMAL LOW (ref 3.5–5.1)

## 2017-11-23 LAB — POC GLUCOSE
Lab: 296 mg/dL — ABNORMAL HIGH (ref 70–100)
Lab: 205 mg/dL — ABNORMAL HIGH (ref 70–100)
Lab: 179 mg/dL — ABNORMAL HIGH (ref 70–100)
Lab: 275 mg/dL — ABNORMAL HIGH (ref 70–100)
Lab: 351 mg/dL — ABNORMAL HIGH (ref 70–100)

## 2017-11-23 MED ORDER — RIFAXIMIN 550 MG PO TAB
550 mg | ORAL_TABLET | Freq: Two times a day (BID) | ORAL | 3 refills | 30.00000 days | Status: AC
Start: 2017-11-23 — End: 2017-11-28

## 2017-11-23 MED ORDER — POTASSIUM CHLORIDE 20 MEQ PO TBTQ
40 meq | Freq: Once | ORAL | 0 refills | Status: CP
Start: 2017-11-23 — End: ?
  Administered 2017-11-23: 10:00:00 40 meq via ORAL

## 2017-11-23 MED ORDER — MAGNESIUM SULFATE IN D5W 1 GRAM/100 ML IV PGBK
1 g | INTRAVENOUS | 0 refills | Status: CP
Start: 2017-11-23 — End: ?
  Administered 2017-11-23 (×2): 1 g via INTRAVENOUS

## 2017-11-23 MED ORDER — RIFAXIMIN 550 MG PO TAB
550 mg | ORAL_TABLET | Freq: Two times a day (BID) | ORAL | 3 refills | 30.00000 days | Status: AC
Start: 2017-11-23 — End: 2017-11-23
  Filled 2017-11-23: qty 180, 30d supply

## 2017-11-24 ENCOUNTER — Encounter: Admit: 2017-11-24 | Discharge: 2017-11-24 | Payer: MEDICARE

## 2017-11-24 LAB — BASIC METABOLIC PANEL
Lab: 135 MMOL/L — ABNORMAL LOW (ref 137–147)
Lab: 2.5 mg/dL — ABNORMAL HIGH (ref 0.4–1.24)
Lab: 21 MMOL/L (ref 21–30)
Lab: 25 mL/min — ABNORMAL LOW (ref >60)
Lab: 27 mg/dL — ABNORMAL HIGH (ref 7–25)
Lab: 3.9 MMOL/L (ref 3.5–5.1)
Lab: 31 mL/min — ABNORMAL LOW (ref >60)
Lab: 332 mg/dL — ABNORMAL HIGH (ref 70–100)
Lab: 7.3 mg/dL — ABNORMAL LOW (ref 8.5–10.6)
Lab: 8 (ref 3–12)

## 2017-11-24 LAB — CBC: Lab: 9.4 K/UL — ABNORMAL HIGH (ref >60)

## 2017-11-24 LAB — COMPREHENSIVE METABOLIC PANEL: Lab: 137 MMOL/L — ABNORMAL LOW (ref >60)

## 2017-11-24 LAB — PHOSPHORUS: Lab: 2.5 mg/dL — ABNORMAL LOW (ref >60)

## 2017-11-24 LAB — POC GLUCOSE
Lab: 341 mg/dL — ABNORMAL HIGH (ref 70–100)
Lab: 91 mg/dL (ref 70–100)
Lab: 152 mg/dL — ABNORMAL HIGH (ref 70–100)
Lab: 170 mg/dL — ABNORMAL HIGH (ref 70–100)

## 2017-11-24 LAB — MAGNESIUM: Lab: 2.2 mg/dL — ABNORMAL LOW (ref >60)

## 2017-11-24 MED ORDER — METOCLOPRAMIDE HCL 5 MG/ML IJ SOLN
10 mg | Freq: Once | INTRAVENOUS | 0 refills | Status: DC | PRN
Start: 2017-11-24 — End: 2017-11-24

## 2017-11-24 MED ORDER — FENTANYL CITRATE (PF) 50 MCG/ML IJ SOLN
50 ug | INTRAVENOUS | 0 refills | Status: DC | PRN
Start: 2017-11-24 — End: 2017-11-24

## 2017-11-24 MED ORDER — FENTANYL CITRATE (PF) 50 MCG/ML IJ SOLN
25 ug | INTRAVENOUS | 0 refills | Status: DC | PRN
Start: 2017-11-24 — End: 2017-11-24

## 2017-11-24 MED ORDER — PROMETHAZINE 25 MG/ML IJ SOLN
6.25 mg | INTRAVENOUS | 0 refills | Status: DC | PRN
Start: 2017-11-24 — End: 2017-11-24

## 2017-11-24 MED ORDER — POTASSIUM CHLORIDE 20 MEQ PO TBTQ
40 meq | Freq: Once | ORAL | 0 refills | Status: CP
Start: 2017-11-24 — End: ?
  Administered 2017-11-24: 11:00:00 40 meq via ORAL

## 2017-11-24 MED ORDER — DIPHENHYDRAMINE HCL 50 MG/ML IJ SOLN
25 mg | Freq: Once | INTRAVENOUS | 0 refills | Status: DC | PRN
Start: 2017-11-24 — End: 2017-11-24

## 2017-11-25 LAB — CBC
Lab: 8.5 K/UL — ABNORMAL LOW (ref 4.5–11.0)
Lab: 14 % (ref 11–15)
Lab: 2.8 M/UL — ABNORMAL LOW (ref 4.4–5.5)
Lab: 25 % — ABNORMAL LOW (ref 40–50)
Lab: 29 pg (ref 26–34)
Lab: 33 g/dL (ref 32.0–36.0)
Lab: 334 10*3/uL (ref 150–400)
Lab: 8.5 FL (ref 7–11)
Lab: 8.6 g/dL — ABNORMAL LOW (ref 13.5–16.5)
Lab: 89 FL (ref 80–100)
Lab: 9.8 10*3/uL (ref 4.5–11.0)

## 2017-11-25 LAB — PHOSPHORUS: Lab: 2.3 mg/dL — ABNORMAL LOW (ref >60)

## 2017-11-25 LAB — BASIC METABOLIC PANEL
Lab: 109 MMOL/L (ref 98–110)
Lab: 137 MMOL/L (ref 137–147)
Lab: 4.2 MMOL/L (ref 3.5–5.1)
Lab: 6 (ref 3–12)

## 2017-11-25 LAB — POC GLUCOSE
Lab: 242 mg/dL — ABNORMAL HIGH (ref 70–100)
Lab: 80 mg/dL (ref 70–100)
Lab: 96 mg/dL (ref 70–100)
Lab: 130 mg/dL — ABNORMAL HIGH (ref 70–100)

## 2017-11-25 LAB — LIPID PROFILE
Lab: 103 mg/dL (ref <200)
Lab: 133 mg/dL (ref <150)
Lab: 25 mg/dL — ABNORMAL LOW (ref >40)
Lab: 27 mg/dL
Lab: 54 mg/dL (ref <100)
Lab: 78 mg/dL

## 2017-11-25 LAB — MAGNESIUM: Lab: 2.1 mg/dL — ABNORMAL LOW (ref 1.6–2.6)

## 2017-11-25 MED ORDER — GABAPENTIN 100 MG PO CAP
200 mg | Freq: Three times a day (TID) | ORAL | 0 refills | Status: DC
Start: 2017-11-25 — End: 2017-11-25
  Administered 2017-11-25: 14:00:00 200 mg via ORAL

## 2017-11-25 MED ORDER — INSULIN ASPART 100 UNIT/ML SC FLEXPEN
4 [IU] | Freq: Three times a day (TID) | SUBCUTANEOUS | 0 refills | Status: DC
Start: 2017-11-25 — End: 2017-11-25

## 2017-11-25 MED ORDER — OXYCODONE 5 MG PO TAB
5-10 mg | ORAL | 0 refills | Status: DC | PRN
Start: 2017-11-25 — End: 2017-11-26
  Administered 2017-11-25: 21:00:00 5 mg via ORAL
  Administered 2017-11-26 (×3): 10 mg via ORAL
  Administered 2017-11-26: 12:00:00 5 mg via ORAL

## 2017-11-25 MED ORDER — INSULIN ASPART 100 UNIT/ML SC FLEXPEN
0-12 [IU] | Freq: Every day | SUBCUTANEOUS | 0 refills | Status: DC
Start: 2017-11-25 — End: 2017-11-28

## 2017-11-25 MED ORDER — GABAPENTIN 100 MG PO CAP
200 mg | Freq: Every evening | ORAL | 0 refills | Status: DC
Start: 2017-11-25 — End: 2017-11-26
  Administered 2017-11-26: 02:00:00 200 mg via ORAL

## 2017-11-25 MED ORDER — LOSARTAN 50 MG PO TAB
100 mg | Freq: Every day | ORAL | 0 refills | Status: DC
Start: 2017-11-25 — End: 2017-11-26
  Administered 2017-11-25 – 2017-11-26 (×3): 100 mg via ORAL

## 2017-11-25 MED ORDER — INSULIN ASPART 100 UNIT/ML SC FLEXPEN
6 [IU] | Freq: Three times a day (TID) | SUBCUTANEOUS | 0 refills | Status: DC
Start: 2017-11-25 — End: 2017-11-28
  Administered 2017-11-28: 14:00:00 6 [IU] via SUBCUTANEOUS

## 2017-11-26 ENCOUNTER — Encounter: Admit: 2017-11-26 | Discharge: 2017-11-26 | Payer: MEDICARE

## 2017-11-26 LAB — MAGNESIUM: Lab: 2 mg/dL — ABNORMAL HIGH (ref >60)

## 2017-11-26 LAB — URINALYSIS, MICROSCOPIC

## 2017-11-26 LAB — POC GLUCOSE
Lab: 167 mg/dL — ABNORMAL HIGH (ref 70–100)
Lab: 134 mg/dL — ABNORMAL HIGH (ref 70–100)
Lab: 71 mg/dL — ABNORMAL LOW (ref 70–100)
Lab: 57 mg/dL — ABNORMAL LOW (ref <100)
Lab: 96 mg/dL (ref 70–100)

## 2017-11-26 LAB — URINALYSIS DIPSTICK
Lab: NEGATIVE
Lab: NEGATIVE
Lab: NEGATIVE
Lab: NEGATIVE
Lab: NEGATIVE

## 2017-11-26 LAB — PROTEIN/CR RATIO,UR RAN
Lab: 312 mg/dL (ref 0–3)
Lab: 4.1 — AB (ref 0–5)
Lab: 76 mg/dL (ref 5.0–8.0)

## 2017-11-26 LAB — BASIC METABOLIC PANEL: Lab: 137 MMOL/L — ABNORMAL LOW (ref >60)

## 2017-11-26 LAB — PHOSPHORUS: Lab: 2.6 mg/dL — ABNORMAL HIGH (ref 2.0–4.5)

## 2017-11-26 LAB — CBC AND DIFF: Lab: 8.9 K/UL — ABNORMAL HIGH (ref >60)

## 2017-11-26 MED ORDER — GABAPENTIN 100 MG PO CAP
200 mg | Freq: Two times a day (BID) | ORAL | 0 refills | Status: DC
Start: 2017-11-26 — End: 2017-11-28
  Administered 2017-11-26 – 2017-11-28 (×5): 200 mg via ORAL

## 2017-11-26 MED ORDER — GABAPENTIN 100 MG PO CAP
200 mg | Freq: Two times a day (BID) | ORAL | 0 refills | Status: DC
Start: 2017-11-26 — End: 2017-11-26

## 2017-11-26 MED ORDER — INSULIN GLARGINE 100 UNIT/ML (3 ML) SC INJ PEN
20 [IU] | Freq: Every evening | SUBCUTANEOUS | 0 refills | Status: DC
Start: 2017-11-26 — End: 2017-11-28

## 2017-11-26 MED ORDER — OXYCODONE 10 MG PO TAB
10-15 mg | ORAL | 0 refills | Status: DC | PRN
Start: 2017-11-26 — End: 2017-11-28
  Administered 2017-11-27 (×2): 10 mg via ORAL
  Administered 2017-11-27: 12:00:00 5 mg via ORAL
  Administered 2017-11-27: 20:00:00 15 mg via ORAL
  Administered 2017-11-27: 02:00:00 10 mg via ORAL
  Administered 2017-11-28: 05:00:00 15 mg via ORAL
  Administered 2017-11-28: 01:00:00 10 mg via ORAL
  Administered 2017-11-28: 14:00:00 15 mg via ORAL

## 2017-11-27 ENCOUNTER — Encounter: Admit: 2017-11-27 | Discharge: 2017-11-27 | Payer: MEDICARE

## 2017-11-27 LAB — POC GLUCOSE
Lab: 208 mg/dL — ABNORMAL HIGH (ref 70–100)
Lab: 194 mg/dL — ABNORMAL HIGH (ref 70–100)
Lab: 91 mg/dL (ref 70–100)
Lab: 73 mg/dL (ref 70–100)
Lab: 212 mg/dL — ABNORMAL HIGH (ref 70–100)
Lab: 50 mg/dL — ABNORMAL LOW (ref 70–100)
Lab: 123 mg/dL — ABNORMAL HIGH (ref 70–100)
Lab: 132 mg/dL — ABNORMAL HIGH (ref 70–100)
Lab: 167 mg/dL — ABNORMAL HIGH (ref 70–100)

## 2017-11-27 LAB — MAGNESIUM: Lab: 2 mg/dL — ABNORMAL HIGH (ref >60)

## 2017-11-27 LAB — CBC AND DIFF: Lab: 7.2 K/UL — ABNORMAL LOW (ref 4.5–11.0)

## 2017-11-27 LAB — BASIC METABOLIC PANEL: Lab: 135 MMOL/L — ABNORMAL LOW (ref >60)

## 2017-11-27 LAB — PHOSPHORUS: Lab: 2.9 mg/dL — ABNORMAL HIGH (ref 2.0–4.5)

## 2017-11-27 MED ORDER — PNEUMOC 13-VAL CONJ-DIP CR(PF) 0.5 ML IM SYRG
.5 mL | Freq: Once | INTRAMUSCULAR | 0 refills | Status: CP
Start: 2017-11-27 — End: ?
  Administered 2017-11-28: 14:00:00 0.5 mL via INTRAMUSCULAR

## 2017-11-27 MED ORDER — OXYCODONE 5 MG PO TAB
5 mg | Freq: Once | ORAL | 0 refills | Status: CP
Start: 2017-11-27 — End: ?
  Administered 2017-11-27: 23:00:00 5 mg via ORAL

## 2017-11-27 MED ORDER — HIB CONJ VACC (ACTHIB) DUAL COMPONENT INJ
Freq: Once | INTRAMUSCULAR | 0 refills | Status: CP
Start: 2017-11-27 — End: ?
  Administered 2017-11-28: 14:00:00 0.500 mL via INTRAMUSCULAR

## 2017-11-27 MED ORDER — MENING A CONJ VACC,1 OF 2 (PF) 10 MCG /0.5 ML (FINAL) IM SOLR
.5 mL | Freq: Once | INTRAMUSCULAR | 0 refills | Status: CP
Start: 2017-11-27 — End: ?
  Administered 2017-11-28: 14:00:00 0.5 mL via INTRAMUSCULAR

## 2017-11-27 MED ORDER — MENIN C,Y,W-135 VAC,2 OF 2(PF) 5 MCG X 3/ 0.5 ML (FINAL) IM SOLR
1 | Freq: Once | INTRAMUSCULAR | 0 refills | Status: CP
Start: 2017-11-27 — End: ?

## 2017-11-27 MED ORDER — MENINGOCOCCAL B VACCINE,4-COMP 50-50-50-25 MCG/0.5 ML IM SYRG
.5 mL | Freq: Once | INTRAMUSCULAR | 0 refills | Status: CP
Start: 2017-11-27 — End: ?
  Administered 2017-11-28: 14:00:00 0.5 mL via INTRAMUSCULAR

## 2017-11-27 MED ADMIN — DEXTROSE 50 % IN WATER (D50W) IV SYRG [2365]: 50 mL | INTRAVENOUS | @ 22:00:00 | Stop: 2017-11-27 | NDC 00409751716

## 2017-11-28 ENCOUNTER — Inpatient Hospital Stay: Admit: 2017-11-06 | Discharge: 2017-11-06 | Payer: MEDICARE

## 2017-11-28 ENCOUNTER — Inpatient Hospital Stay: Admit: 2017-10-26 | Discharge: 2017-10-26 | Payer: MEDICARE

## 2017-11-28 ENCOUNTER — Inpatient Hospital Stay: Admit: 2017-10-22 | Discharge: 2017-10-22 | Payer: MEDICARE

## 2017-11-28 ENCOUNTER — Inpatient Hospital Stay: Admit: 2017-11-12 | Discharge: 2017-11-12 | Payer: MEDICARE

## 2017-11-28 ENCOUNTER — Inpatient Hospital Stay: Admit: 2017-11-13 | Discharge: 2017-11-13 | Payer: MEDICARE

## 2017-11-28 ENCOUNTER — Inpatient Hospital Stay: Admit: 2017-11-16 | Discharge: 2017-11-16 | Payer: MEDICARE

## 2017-11-28 ENCOUNTER — Inpatient Hospital Stay: Admit: 2017-10-24 | Discharge: 2017-10-24 | Payer: MEDICARE

## 2017-11-28 ENCOUNTER — Inpatient Hospital Stay: Admit: 2017-11-21 | Discharge: 2017-11-21 | Payer: MEDICARE

## 2017-11-28 ENCOUNTER — Inpatient Hospital Stay: Admit: 2017-10-25 | Discharge: 2017-10-25 | Payer: MEDICARE

## 2017-11-28 ENCOUNTER — Encounter: Admit: 2017-11-28 | Discharge: 2017-11-28 | Payer: MEDICARE

## 2017-11-28 ENCOUNTER — Ambulatory Visit: Admit: 2017-10-22 | Discharge: 2017-10-22 | Payer: MEDICARE

## 2017-11-28 ENCOUNTER — Inpatient Hospital Stay: Admit: 2017-11-07 | Discharge: 2017-11-07 | Payer: MEDICARE

## 2017-11-28 ENCOUNTER — Inpatient Hospital Stay: Admit: 2017-11-15 | Discharge: 2017-11-15 | Payer: MEDICARE

## 2017-11-28 ENCOUNTER — Inpatient Hospital Stay: Admit: 2017-11-02 | Discharge: 2017-11-02 | Payer: MEDICARE

## 2017-11-28 ENCOUNTER — Inpatient Hospital Stay: Admit: 2017-10-28 | Discharge: 2017-10-28 | Payer: MEDICARE

## 2017-11-28 DIAGNOSIS — Z23 Encounter for immunization: ICD-10-CM

## 2017-11-28 DIAGNOSIS — D62 Acute posthemorrhagic anemia: ICD-10-CM

## 2017-11-28 DIAGNOSIS — L97429 Non-pressure chronic ulcer of left heel and midfoot with unspecified severity: ICD-10-CM

## 2017-11-28 DIAGNOSIS — I5032 Chronic diastolic (congestive) heart failure: ICD-10-CM

## 2017-11-28 DIAGNOSIS — R262 Difficulty in walking, not elsewhere classified: ICD-10-CM

## 2017-11-28 DIAGNOSIS — R571 Hypovolemic shock: ICD-10-CM

## 2017-11-28 DIAGNOSIS — K7460 Unspecified cirrhosis of liver: ICD-10-CM

## 2017-11-28 DIAGNOSIS — G9341 Metabolic encephalopathy: ICD-10-CM

## 2017-11-28 DIAGNOSIS — E1152 Type 2 diabetes mellitus with diabetic peripheral angiopathy with gangrene: Principal | ICD-10-CM

## 2017-11-28 DIAGNOSIS — I864 Gastric varices: ICD-10-CM

## 2017-11-28 DIAGNOSIS — E11621 Type 2 diabetes mellitus with foot ulcer: ICD-10-CM

## 2017-11-28 DIAGNOSIS — L03116 Cellulitis of left lower limb: ICD-10-CM

## 2017-11-28 DIAGNOSIS — E872 Acidosis: ICD-10-CM

## 2017-11-28 DIAGNOSIS — K921 Melena: ICD-10-CM

## 2017-11-28 DIAGNOSIS — N179 Acute kidney failure, unspecified: ICD-10-CM

## 2017-11-28 DIAGNOSIS — N184 Chronic kidney disease, stage 4 (severe): ICD-10-CM

## 2017-11-28 DIAGNOSIS — K920 Hematemesis: ICD-10-CM

## 2017-11-28 DIAGNOSIS — I70262 Atherosclerosis of native arteries of extremities with gangrene, left leg: ICD-10-CM

## 2017-11-28 DIAGNOSIS — Z9049 Acquired absence of other specified parts of digestive tract: ICD-10-CM

## 2017-11-28 DIAGNOSIS — I9788 Other intraoperative complications of the circulatory system, not elsewhere classified: ICD-10-CM

## 2017-11-28 DIAGNOSIS — K219 Gastro-esophageal reflux disease without esophagitis: ICD-10-CM

## 2017-11-28 DIAGNOSIS — E1165 Type 2 diabetes mellitus with hyperglycemia: ICD-10-CM

## 2017-11-28 DIAGNOSIS — E1142 Type 2 diabetes mellitus with diabetic polyneuropathy: ICD-10-CM

## 2017-11-28 DIAGNOSIS — I2510 Atherosclerotic heart disease of native coronary artery without angina pectoris: ICD-10-CM

## 2017-11-28 DIAGNOSIS — D61818 Other pancytopenia: ICD-10-CM

## 2017-11-28 DIAGNOSIS — E875 Hyperkalemia: ICD-10-CM

## 2017-11-28 DIAGNOSIS — F329 Major depressive disorder, single episode, unspecified: ICD-10-CM

## 2017-11-28 DIAGNOSIS — G839 Paralytic syndrome, unspecified: ICD-10-CM

## 2017-11-28 DIAGNOSIS — K766 Portal hypertension: ICD-10-CM

## 2017-11-28 DIAGNOSIS — Z794 Long term (current) use of insulin: ICD-10-CM

## 2017-11-28 DIAGNOSIS — D631 Anemia in chronic kidney disease: ICD-10-CM

## 2017-11-28 DIAGNOSIS — E11649 Type 2 diabetes mellitus with hypoglycemia without coma: ICD-10-CM

## 2017-11-28 DIAGNOSIS — R278 Other lack of coordination: ICD-10-CM

## 2017-11-28 DIAGNOSIS — R5382 Chronic fatigue, unspecified: ICD-10-CM

## 2017-11-28 DIAGNOSIS — E871 Hypo-osmolality and hyponatremia: ICD-10-CM

## 2017-11-28 DIAGNOSIS — Z79899 Other long term (current) drug therapy: ICD-10-CM

## 2017-11-28 DIAGNOSIS — Z955 Presence of coronary angioplasty implant and graft: ICD-10-CM

## 2017-11-28 DIAGNOSIS — R162 Hepatomegaly with splenomegaly, not elsewhere classified: ICD-10-CM

## 2017-11-28 DIAGNOSIS — I130 Hypertensive heart and chronic kidney disease with heart failure and stage 1 through stage 4 chronic kidney disease, or unspecified chronic kidney disease: ICD-10-CM

## 2017-11-28 DIAGNOSIS — E1122 Type 2 diabetes mellitus with diabetic chronic kidney disease: ICD-10-CM

## 2017-11-28 DIAGNOSIS — Z87891 Personal history of nicotine dependence: ICD-10-CM

## 2017-11-28 DIAGNOSIS — I81 Portal vein thrombosis: ICD-10-CM

## 2017-11-28 DIAGNOSIS — L97521 Non-pressure chronic ulcer of other part of left foot limited to breakdown of skin: Secondary | ICD-10-CM

## 2017-11-28 DIAGNOSIS — B9689 Other specified bacterial agents as the cause of diseases classified elsewhere: ICD-10-CM

## 2017-11-28 DIAGNOSIS — I748 Embolism and thrombosis of other arteries: ICD-10-CM

## 2017-11-28 DIAGNOSIS — E785 Hyperlipidemia, unspecified: ICD-10-CM

## 2017-11-28 DIAGNOSIS — K861 Other chronic pancreatitis: ICD-10-CM

## 2017-11-28 LAB — MAGNESIUM: Lab: 2 mg/dL — ABNORMAL LOW (ref >60)

## 2017-11-28 LAB — POC GLUCOSE
Lab: 169 mg/dL — ABNORMAL HIGH (ref 70–100)
Lab: 193 mg/dL — ABNORMAL HIGH (ref 70–100)
Lab: 152 mg/dL — ABNORMAL HIGH (ref 70–100)
Lab: 103 mg/dL — ABNORMAL HIGH (ref 70–100)

## 2017-11-28 LAB — PHOSPHORUS: Lab: 4 mg/dL — ABNORMAL LOW (ref 2.0–4.5)

## 2017-11-28 LAB — CBC AND DIFF: Lab: 7.5 K/UL — ABNORMAL HIGH (ref 4.5–11.0)

## 2017-11-28 LAB — BASIC METABOLIC PANEL: Lab: 131 MMOL/L — ABNORMAL LOW (ref >60)

## 2017-11-28 MED ORDER — MUPIROCIN 2 % TP OINT
Freq: Every day | TOPICAL | 0 refills | 11.00000 days | Status: AC
Start: 2017-11-28 — End: 2017-11-28

## 2017-11-28 MED ORDER — OXYCODONE 10 MG PO TAB
15 mg | ORAL_TABLET | ORAL | 0 refills | 6.00000 days | Status: AC | PRN
Start: 2017-11-28 — End: 2017-11-28

## 2017-11-28 MED ORDER — FOLIC ACID 1 MG PO TAB
1 mg | ORAL_TABLET | Freq: Every day | ORAL | 0 refills | Status: AC
Start: 2017-11-28 — End: ?

## 2017-11-28 MED ORDER — RIFAXIMIN 550 MG PO TAB
550 mg | ORAL_TABLET | Freq: Two times a day (BID) | ORAL | 3 refills | 30.00000 days | Status: AC
Start: 2017-11-28 — End: 2018-01-30

## 2017-11-28 MED ORDER — PANTOPRAZOLE 40 MG PO TBEC
40 mg | ORAL_TABLET | Freq: Two times a day (BID) | ORAL | 3 refills | 90.00000 days | Status: AC
Start: 2017-11-28 — End: ?

## 2017-11-28 MED ORDER — NAPROXEN 500 MG PO TAB
500 mg | Freq: Once | ORAL | 0 refills | Status: DC
Start: 2017-11-28 — End: 2017-11-28

## 2017-11-28 MED ORDER — ATORVASTATIN 40 MG PO TAB
40 mg | ORAL_TABLET | Freq: Every day | ORAL | 3 refills | Status: AC
Start: 2017-11-28 — End: 2018-01-30

## 2017-11-28 MED ORDER — AMLODIPINE 5 MG PO TAB
10 mg | ORAL_TABLET | Freq: Every morning | ORAL | 3 refills | Status: SS
Start: 2017-11-28 — End: 2017-12-06

## 2017-11-28 MED ORDER — LACTULOSE 10 GRAM/15 ML PO SOLN
20 g | Freq: Three times a day (TID) | ORAL | 0 refills | Status: SS
Start: 2017-11-28 — End: 2018-01-14

## 2017-11-28 MED ORDER — LACTULOSE 10 GRAM/15 ML PO SOLN
20 g | Freq: Three times a day (TID) | ORAL | 0 refills | 21.00000 days | Status: AC
Start: 2017-11-28 — End: 2017-11-28

## 2017-11-28 MED ORDER — GABAPENTIN 100 MG PO CAP
200 mg | ORAL_CAPSULE | Freq: Two times a day (BID) | ORAL | 0 refills | Status: AC
Start: 2017-11-28 — End: 2017-11-28

## 2017-11-28 MED ORDER — ATORVASTATIN 40 MG PO TAB
40 mg | ORAL_TABLET | Freq: Every day | ORAL | 3 refills | Status: AC
Start: 2017-11-28 — End: 2017-11-28

## 2017-11-28 MED ORDER — GABAPENTIN 100 MG PO CAP
200 mg | ORAL_CAPSULE | Freq: Two times a day (BID) | ORAL | 0 refills | Status: AC
Start: 2017-11-28 — End: 2017-12-13

## 2017-11-28 MED ORDER — MUPIROCIN 2 % TP OINT
Freq: Every day | TOPICAL | 0 refills | Status: SS
Start: 2017-11-28 — End: 2018-01-22

## 2017-11-28 MED ORDER — FOLIC ACID 1 MG PO TAB
1 mg | ORAL_TABLET | Freq: Every day | ORAL | 0 refills | Status: AC
Start: 2017-11-28 — End: 2017-11-28

## 2017-11-28 MED ORDER — AMLODIPINE 5 MG PO TAB
10 mg | ORAL_TABLET | Freq: Every morning | ORAL | 3 refills | Status: AC
Start: 2017-11-28 — End: 2017-11-28

## 2017-11-28 MED ORDER — FERROUS SULFATE 325 MG (65 MG IRON) PO TAB
650 mg | ORAL_TABLET | Freq: Three times a day (TID) | ORAL | 3 refills | Status: SS
Start: 2017-11-28 — End: 2018-02-22

## 2017-11-28 MED ORDER — INSULIN GLARGINE 100 UNIT/ML (3 ML) SC INJ PEN
25 [IU] | Freq: Every day | SUBCUTANEOUS | 3 refills | 60.00000 days | Status: AC
Start: 2017-11-28 — End: 2017-12-11

## 2017-11-28 MED ORDER — INSULIN GLARGINE 100 UNIT/ML (3 ML) SC INJ PEN
25 [IU] | Freq: Every day | SUBCUTANEOUS | 3 refills | 60.00000 days | Status: AC
Start: 2017-11-28 — End: 2017-11-28

## 2017-11-28 MED ORDER — OXYCODONE 10 MG PO TAB
15 mg | ORAL_TABLET | ORAL | 0 refills | 6.00000 days | Status: AC | PRN
Start: 2017-11-28 — End: 2017-12-11

## 2017-11-28 MED ORDER — PANTOPRAZOLE 40 MG PO TBEC
40 mg | ORAL_TABLET | Freq: Two times a day (BID) | ORAL | 3 refills | 90.00000 days | Status: AC
Start: 2017-11-28 — End: 2017-11-28

## 2017-11-28 MED ORDER — ACETAMINOPHEN 500 MG PO TAB
1000 mg | Freq: Once | ORAL | 0 refills | Status: CP
Start: 2017-11-28 — End: ?
  Administered 2017-11-28: 14:00:00 1000 mg via ORAL

## 2017-11-28 MED ORDER — FERROUS SULFATE 325 MG (65 MG IRON) PO TAB
650 mg | ORAL_TABLET | Freq: Three times a day (TID) | ORAL | 3 refills | Status: AC
Start: 2017-11-28 — End: 2017-11-28

## 2017-11-29 ENCOUNTER — Encounter: Admit: 2017-11-29 | Discharge: 2017-11-29 | Payer: MEDICARE

## 2017-12-03 ENCOUNTER — Encounter: Admit: 2017-12-03 | Discharge: 2017-12-03 | Payer: MEDICARE

## 2017-12-04 ENCOUNTER — Encounter: Admit: 2017-12-04 | Discharge: 2017-12-04 | Payer: MEDICARE

## 2017-12-04 ENCOUNTER — Ambulatory Visit: Admit: 2017-12-04 | Discharge: 2017-12-04 | Payer: MEDICARE

## 2017-12-04 DIAGNOSIS — E1165 Type 2 diabetes mellitus with hyperglycemia: Secondary | ICD-10-CM

## 2017-12-04 DIAGNOSIS — K922 Gastrointestinal hemorrhage, unspecified: ICD-10-CM

## 2017-12-04 LAB — CBC AND DIFF: Lab: 8 10*3/uL (ref 4.5–11.0)

## 2017-12-04 LAB — COMPREHENSIVE METABOLIC PANEL
Lab: 0.4 mg/dL — ABNORMAL LOW (ref 0.3–1.2)
Lab: 106 U/L (ref 25–110)
Lab: 12 U/L (ref 7–56)
Lab: 138 MMOL/L — ABNORMAL LOW (ref 137–147)
Lab: 16 U/L (ref 7–40)
Lab: 2.3 g/dL — ABNORMAL LOW (ref 3.5–5.0)
Lab: 2.3 mg/dL — ABNORMAL HIGH (ref 0.4–1.24)
Lab: 23 MMOL/L (ref 21–30)
Lab: 28 mL/min — ABNORMAL LOW (ref >60)
Lab: 34 mL/min — ABNORMAL LOW (ref >60)
Lab: 4.9 g/dL — ABNORMAL LOW (ref 6.0–8.0)
Lab: 7 10*3/uL (ref 3–12)
Lab: 7.8 mg/dL — ABNORMAL LOW (ref 8.5–10.6)

## 2017-12-04 LAB — MAGNESIUM: Lab: 2.1 mg/dL (ref 1.6–2.6)

## 2017-12-04 LAB — POC GLUCOSE
Lab: 82 mg/dL (ref 70–100)
Lab: 78 mg/dL (ref 70–100)
Lab: 115 mg/dL — ABNORMAL HIGH (ref 70–100)
Lab: 72 mg/dL (ref 70–100)
Lab: 101 mg/dL — ABNORMAL HIGH (ref 70–100)

## 2017-12-04 LAB — LACTIC ACID (BG - RAPID LACTATE): Lab: 0.8 MMOL/L — ABNORMAL HIGH (ref 0.5–2.0)

## 2017-12-04 LAB — LIPASE: Lab: 8 U/L — ABNORMAL LOW (ref 11–82)

## 2017-12-04 LAB — PTT (APTT): Lab: 24 s — ABNORMAL LOW (ref 24.0–36.5)

## 2017-12-04 LAB — IONIZED CALCIUM: Lab: 1.1 MMOL/L (ref 1.0–1.3)

## 2017-12-04 LAB — PHOSPHORUS: Lab: 3.4 mg/dL — ABNORMAL HIGH (ref 2.0–4.5)

## 2017-12-04 LAB — PROTIME INR (PT): Lab: 1.1 M/UL — ABNORMAL LOW (ref 0.8–1.2)

## 2017-12-04 MED ORDER — PROPOFOL INJ 10 MG/ML IV VIAL
0 refills | Status: DC
Start: 2017-12-04 — End: 2017-12-04
  Administered 2017-12-04 (×5): 20 mg via INTRAVENOUS

## 2017-12-04 MED ORDER — SODIUM CHLORIDE 0.9 % IV SOLP
INTRAVENOUS | 0 refills | Status: CN
Start: 2017-12-04 — End: ?

## 2017-12-04 MED ORDER — TAP WATER/SIMETHICONE IRRIGATION
0 refills | Status: DC
Start: 2017-12-04 — End: 2017-12-04
  Administered 2017-12-04: 22:00:00 60 mL

## 2017-12-04 MED ORDER — AMLODIPINE 10 MG PO TAB
10 mg | Freq: Every morning | ORAL | 0 refills | Status: DC
Start: 2017-12-04 — End: 2017-12-11
  Administered 2017-12-05 – 2017-12-10 (×5): 10 mg via ORAL

## 2017-12-04 MED ORDER — KETAMINE 10 MG/ML IJ SOLN
0 refills | Status: DC
Start: 2017-12-04 — End: 2017-12-04
  Administered 2017-12-04: 22:00:00 15 mg via INTRAVENOUS

## 2017-12-04 MED ORDER — OXYCODONE 5 MG PO TAB
15 mg | ORAL | 0 refills | Status: DC | PRN
Start: 2017-12-04 — End: 2017-12-07
  Administered 2017-12-05 – 2017-12-07 (×8): 15 mg via ORAL

## 2017-12-04 MED ORDER — FENTANYL CITRATE (PF) 50 MCG/ML IJ SOLN
25-50 ug | INTRAVENOUS | 0 refills | Status: DC | PRN
Start: 2017-12-04 — End: 2017-12-05
  Administered 2017-12-04: 18:00:00 25 ug via INTRAVENOUS

## 2017-12-04 MED ORDER — OCTREOTIDE IV DRIP
50 ug/h | INTRAVENOUS | 0 refills | Status: DC
Start: 2017-12-04 — End: 2017-12-05
  Administered 2017-12-04 – 2017-12-05 (×6): 50 ug/h via INTRAVENOUS

## 2017-12-04 MED ORDER — LACTATED RINGERS IV SOLP
0 refills | Status: DC
Start: 2017-12-04 — End: 2017-12-04
  Administered 2017-12-04: 21:00:00 via INTRAVENOUS

## 2017-12-04 MED ORDER — CARVEDILOL 12.5 MG PO TAB
12.5 mg | Freq: Two times a day (BID) | ORAL | 0 refills | Status: DC
Start: 2017-12-04 — End: 2017-12-05
  Administered 2017-12-05: 02:00:00 12.5 mg via ORAL

## 2017-12-04 MED ORDER — CARVEDILOL 12.5 MG PO TAB
12.5 mg | Freq: Once | ORAL | 0 refills | Status: CP
Start: 2017-12-04 — End: ?
  Administered 2017-12-05: 03:00:00 12.5 mg via ORAL

## 2017-12-04 MED ORDER — INSULIN ASPART 100 UNIT/ML SC FLEXPEN
0-12 [IU] | Freq: Before meals | SUBCUTANEOUS | 0 refills | Status: DC
Start: 2017-12-04 — End: 2017-12-10
  Administered 2017-12-05: 18:00:00 2 [IU] via SUBCUTANEOUS

## 2017-12-04 MED ORDER — PANTOPRAZOLE IV INFUSION
8 mg/h | INTRAVENOUS | 0 refills | Status: AC
Start: 2017-12-04 — End: ?
  Administered 2017-12-04 – 2017-12-07 (×14): 8 mg/h via INTRAVENOUS

## 2017-12-04 MED ORDER — GABAPENTIN 100 MG PO CAP
200 mg | Freq: Two times a day (BID) | ORAL | 0 refills | Status: DC
Start: 2017-12-04 — End: 2017-12-11
  Administered 2017-12-05 – 2017-12-10 (×11): 200 mg via ORAL

## 2017-12-04 MED ORDER — FUROSEMIDE 10 MG/ML IJ SOLN
40 mg | Freq: Once | INTRAVENOUS | 0 refills | Status: CP
Start: 2017-12-04 — End: ?
  Administered 2017-12-04: 20:00:00 40 mg via INTRAVENOUS

## 2017-12-04 MED ADMIN — DEXTROSE 50 % IN WATER (D50W) IV SYRG [2365]: 25 mL | INTRAVENOUS | @ 21:00:00 | Stop: 2017-12-04 | NDC 00409751716

## 2017-12-05 LAB — BASIC METABOLIC PANEL
Lab: 108 MMOL/L (ref 98–110)
Lab: 132 mg/dL — ABNORMAL HIGH (ref 70–100)
Lab: 137 MMOL/L (ref 137–147)
Lab: 29 mL/min — ABNORMAL LOW (ref >60)
Lab: 35 mL/min — ABNORMAL LOW (ref >60)
Lab: 5.3 MMOL/L — ABNORMAL HIGH (ref 3.5–5.1)
Lab: 50 mg/dL — ABNORMAL HIGH (ref 7–25)
Lab: 7.7 mg/dL — ABNORMAL LOW (ref 8.5–10.6)
Lab: 4 (ref 3–12)

## 2017-12-05 LAB — MAGNESIUM: Lab: 2 mg/dL — ABNORMAL HIGH (ref 1.6–2.6)

## 2017-12-05 LAB — COMPREHENSIVE METABOLIC PANEL: Lab: 137 MMOL/L — ABNORMAL LOW (ref 137–147)

## 2017-12-05 LAB — POC GLUCOSE
Lab: 85 mg/dL (ref 70–100)
Lab: 75 mg/dL — ABNORMAL HIGH (ref 70–100)
Lab: 137 mg/dL — ABNORMAL HIGH (ref 70–100)
Lab: 186 mg/dL — ABNORMAL HIGH (ref 70–100)
Lab: 279 mg/dL — ABNORMAL HIGH (ref 70–100)

## 2017-12-05 LAB — PHOSPHORUS: Lab: 4 mg/dL — ABNORMAL LOW (ref 2.0–4.5)

## 2017-12-05 LAB — CBC AND DIFF: Lab: 5.9 K/UL (ref >60)

## 2017-12-05 MED ORDER — LACTULOSE 10 GRAM/15 ML PO SOLN
20 g | Freq: Three times a day (TID) | ORAL | 0 refills | Status: DC
Start: 2017-12-05 — End: 2017-12-11
  Administered 2017-12-05 – 2017-12-10 (×11): 20 g via ORAL

## 2017-12-05 MED ORDER — CITALOPRAM 40 MG PO TAB
40 mg | Freq: Every day | ORAL | 0 refills | Status: DC
Start: 2017-12-05 — End: 2017-12-11
  Administered 2017-12-05 – 2017-12-10 (×6): 40 mg via ORAL

## 2017-12-05 MED ORDER — CALCIUM GLUCONATE 1GM/100ML NS MB+
1 g | Freq: Once | INTRAVENOUS | 0 refills | Status: CP
Start: 2017-12-05 — End: ?
  Administered 2017-12-05 (×2): 1 g via INTRAVENOUS

## 2017-12-05 MED ORDER — CARVEDILOL 25 MG PO TAB
25 mg | Freq: Two times a day (BID) | ORAL | 0 refills | Status: DC
Start: 2017-12-05 — End: 2017-12-06
  Administered 2017-12-05 – 2017-12-06 (×3): 25 mg via ORAL

## 2017-12-05 MED ORDER — FUROSEMIDE 10 MG/ML IJ SOLN
40 mg | Freq: Once | INTRAVENOUS | 0 refills | Status: CP
Start: 2017-12-05 — End: ?
  Administered 2017-12-05: 11:00:00 40 mg via INTRAVENOUS

## 2017-12-05 MED ORDER — DEXTROSE 50 % IN WATER (D50W) IV SYRG
25 g | Freq: Once | INTRAVENOUS | 0 refills | Status: CP
Start: 2017-12-05 — End: ?
  Administered 2017-12-05: 11:00:00 50 mL via INTRAVENOUS

## 2017-12-05 MED ORDER — MELATONIN 3 MG PO TAB
3 mg | Freq: Once | ORAL | 0 refills | Status: CP
Start: 2017-12-05 — End: ?
  Administered 2017-12-05: 07:00:00 3 mg via ORAL

## 2017-12-05 MED ORDER — INSULIN GLARGINE 100 UNIT/ML (3 ML) SC INJ PEN
10 [IU] | Freq: Every evening | SUBCUTANEOUS | 0 refills | Status: DC
Start: 2017-12-05 — End: 2017-12-06
  Administered 2017-12-06: 03:00:00 10 [IU] via SUBCUTANEOUS

## 2017-12-05 MED ORDER — FUROSEMIDE 40 MG PO TAB
40 mg | Freq: Every morning | ORAL | 0 refills | Status: DC
Start: 2017-12-05 — End: 2017-12-07
  Administered 2017-12-05 – 2017-12-07 (×3): 40 mg via ORAL

## 2017-12-05 MED ORDER — INSULIN REGULAR HUMAN(#) 1 UNIT/ML IJ SYRINGE
10 [IU] | Freq: Once | INTRAVENOUS | 0 refills | Status: CP
Start: 2017-12-05 — End: ?
  Administered 2017-12-05: 11:00:00 10 [IU] via INTRAVENOUS

## 2017-12-05 MED ORDER — ROPINIROLE 1 MG PO TAB
4 mg | Freq: Every evening | ORAL | 0 refills | Status: DC
Start: 2017-12-05 — End: 2017-12-11
  Administered 2017-12-06 – 2017-12-10 (×5): 4 mg via ORAL

## 2017-12-05 MED ORDER — PANTOPRAZOLE 40 MG IV SOLR
40 mg | Freq: Two times a day (BID) | INTRAVENOUS | 0 refills | Status: DC
Start: 2017-12-05 — End: 2017-12-08
  Administered 2017-12-08: 02:00:00 40 mg via INTRAVENOUS

## 2017-12-05 MED ORDER — RIFAXIMIN 550 MG PO TAB
550 mg | Freq: Two times a day (BID) | ORAL | 0 refills | Status: DC
Start: 2017-12-05 — End: 2017-12-11
  Administered 2017-12-05 – 2017-12-10 (×11): 550 mg via ORAL

## 2017-12-06 ENCOUNTER — Encounter: Admit: 2017-12-06 | Discharge: 2017-12-06 | Payer: MEDICARE

## 2017-12-06 DIAGNOSIS — R0600 Dyspnea, unspecified: ICD-10-CM

## 2017-12-06 DIAGNOSIS — K859 Acute pancreatitis without necrosis or infection, unspecified: ICD-10-CM

## 2017-12-06 DIAGNOSIS — I10 Essential (primary) hypertension: Principal | ICD-10-CM

## 2017-12-06 DIAGNOSIS — F17200 Nicotine dependence, unspecified, uncomplicated: ICD-10-CM

## 2017-12-06 DIAGNOSIS — E785 Hyperlipidemia, unspecified: ICD-10-CM

## 2017-12-06 DIAGNOSIS — E119 Type 2 diabetes mellitus without complications: ICD-10-CM

## 2017-12-06 LAB — POC GLUCOSE
Lab: 193 mg/dL — ABNORMAL HIGH (ref 70–100)
Lab: 176 mg/dL — ABNORMAL HIGH (ref 70–100)
Lab: 245 mg/dL — ABNORMAL HIGH (ref 70–100)
Lab: 247 mg/dL — ABNORMAL HIGH (ref 70–100)

## 2017-12-06 LAB — COMPREHENSIVE METABOLIC PANEL: Lab: 136 MMOL/L — ABNORMAL LOW (ref >60)

## 2017-12-06 LAB — CBC AND DIFF: Lab: 7.4 K/UL — ABNORMAL LOW (ref 4.5–11.0)

## 2017-12-06 LAB — PHOSPHORUS: Lab: 4.7 mg/dL — ABNORMAL HIGH (ref >60)

## 2017-12-06 LAB — MAGNESIUM: Lab: 2 mg/dL — ABNORMAL LOW (ref 1.6–2.6)

## 2017-12-06 MED ORDER — INSULIN GLARGINE 100 UNIT/ML (3 ML) SC INJ PEN
20 [IU] | Freq: Every evening | SUBCUTANEOUS | 0 refills | Status: DC
Start: 2017-12-06 — End: 2017-12-07

## 2017-12-06 MED ORDER — ATORVASTATIN 40 MG PO TAB
40 mg | Freq: Every day | ORAL | 0 refills | Status: DC
Start: 2017-12-06 — End: 2017-12-11
  Administered 2017-12-06 – 2017-12-10 (×5): 40 mg via ORAL

## 2017-12-06 MED ORDER — CARVEDILOL 12.5 MG PO TAB
12.5 mg | Freq: Once | ORAL | 0 refills | Status: DC
Start: 2017-12-06 — End: 2017-12-06

## 2017-12-06 MED ORDER — IMS MIXTURE TEMPLATE
37.5 mg | Freq: Two times a day (BID) | ORAL | 0 refills | Status: DC
Start: 2017-12-06 — End: 2017-12-06

## 2017-12-06 MED ORDER — CARVEDILOL 25 MG PO TAB
25 mg | Freq: Two times a day (BID) | ORAL | 0 refills | Status: DC
Start: 2017-12-06 — End: 2017-12-11
  Administered 2017-12-07 – 2017-12-10 (×8): 25 mg via ORAL

## 2017-12-06 MED ORDER — CARVEDILOL 12.5 MG PO TAB
12.5 mg | Freq: Once | ORAL | 0 refills | Status: CP
Start: 2017-12-06 — End: ?
  Administered 2017-12-07: 04:00:00 12.5 mg via ORAL

## 2017-12-06 MED ORDER — ASPIRIN 81 MG PO TBEC
81 mg | Freq: Every day | ORAL | 0 refills | Status: DC
Start: 2017-12-06 — End: 2017-12-11
  Administered 2017-12-07 – 2017-12-10 (×4): 81 mg via ORAL

## 2017-12-07 DIAGNOSIS — K922 Gastrointestinal hemorrhage, unspecified: ICD-10-CM

## 2017-12-07 LAB — POC GLUCOSE
Lab: 148 mg/dL — ABNORMAL HIGH (ref 70–100)
Lab: 128 mg/dL — ABNORMAL HIGH (ref 70–100)
Lab: 42 mg/dL — CL (ref 70–100)
Lab: 59 mg/dL — ABNORMAL LOW (ref 70–100)
Lab: 66 mg/dL — ABNORMAL LOW (ref 70–100)
Lab: 94 mg/dL (ref 70–100)
Lab: 182 mg/dL — ABNORMAL HIGH (ref 70–100)
Lab: 259 mg/dL — ABNORMAL HIGH (ref 70–100)

## 2017-12-07 LAB — COMPREHENSIVE METABOLIC PANEL: Lab: 138 MMOL/L — ABNORMAL LOW (ref 137–147)

## 2017-12-07 LAB — MAGNESIUM: Lab: 1.8 mg/dL — ABNORMAL HIGH (ref 1.6–2.6)

## 2017-12-07 LAB — CBC AND DIFF: Lab: 8 K/UL — ABNORMAL LOW (ref <1.0)

## 2017-12-07 LAB — PHOSPHORUS: Lab: 4 mg/dL — ABNORMAL LOW (ref >60)

## 2017-12-07 MED ORDER — HYDRALAZINE 20 MG/ML IJ SOLN
10 mg | INTRAVENOUS | 0 refills | Status: DC | PRN
Start: 2017-12-07 — End: 2017-12-11
  Administered 2017-12-07: 11:00:00 10 mg via INTRAVENOUS

## 2017-12-07 MED ORDER — OXYCODONE 5 MG PO TAB
5-15 mg | ORAL | 0 refills | Status: DC | PRN
Start: 2017-12-07 — End: 2017-12-11
  Administered 2017-12-07 – 2017-12-10 (×7): 15 mg via ORAL

## 2017-12-07 MED ORDER — INSULIN GLARGINE 100 UNIT/ML (3 ML) SC INJ PEN
15 [IU] | Freq: Every evening | SUBCUTANEOUS | 0 refills | Status: DC
Start: 2017-12-07 — End: 2017-12-08

## 2017-12-07 MED ORDER — FUROSEMIDE 40 MG PO TAB
40 mg | Freq: Two times a day (BID) | ORAL | 0 refills | Status: DC
Start: 2017-12-07 — End: 2017-12-11
  Administered 2017-12-07 – 2017-12-10 (×6): 40 mg via ORAL

## 2017-12-07 MED ORDER — HYDRALAZINE 20 MG/ML IJ SOLN
5 mg | Freq: Once | INTRAVENOUS | 0 refills | Status: CP
Start: 2017-12-07 — End: ?
  Administered 2017-12-07: 06:00:00 5 mg via INTRAVENOUS

## 2017-12-07 MED ADMIN — DEXTROSE 50 % IN WATER (D50W) IV SYRG [2365]: 50 mL | INTRAVENOUS | @ 08:00:00 | Stop: 2017-12-07 | NDC 00409751716

## 2017-12-08 LAB — PHOSPHORUS: Lab: 4.5 mg/dL — ABNORMAL LOW (ref 2.0–4.5)

## 2017-12-08 LAB — COMPREHENSIVE METABOLIC PANEL: Lab: 137 MMOL/L — ABNORMAL LOW (ref >60)

## 2017-12-08 LAB — POC GLUCOSE
Lab: 124 mg/dL — ABNORMAL HIGH (ref 70–100)
Lab: 134 mg/dL — ABNORMAL HIGH (ref 70–100)
Lab: 234 mg/dL — ABNORMAL HIGH (ref 70–100)
Lab: 97 mg/dL (ref 70–100)

## 2017-12-08 LAB — MAGNESIUM: Lab: 1.9 mg/dL — ABNORMAL LOW (ref >60)

## 2017-12-08 LAB — CBC AND DIFF: Lab: 5.9 K/UL — ABNORMAL HIGH (ref >60)

## 2017-12-08 MED ORDER — ACETAMINOPHEN 325 MG PO TAB
650 mg | ORAL | 0 refills | Status: DC | PRN
Start: 2017-12-08 — End: 2017-12-11
  Administered 2017-12-09 – 2017-12-10 (×3): 650 mg via ORAL

## 2017-12-08 MED ORDER — PANTOPRAZOLE 40 MG PO TBEC
40 mg | Freq: Two times a day (BID) | ORAL | 0 refills | Status: DC
Start: 2017-12-08 — End: 2017-12-11
  Administered 2017-12-08 – 2017-12-10 (×5): 40 mg via ORAL

## 2017-12-08 MED ORDER — INSULIN GLARGINE 100 UNIT/ML (3 ML) SC INJ PEN
13 [IU] | Freq: Every evening | SUBCUTANEOUS | 0 refills | Status: DC
Start: 2017-12-08 — End: 2017-12-11

## 2017-12-08 MED ORDER — OXYCODONE 10 MG PO TAB
10 mg | Freq: Once | ORAL | 0 refills | Status: CP
Start: 2017-12-08 — End: ?
  Administered 2017-12-09: 05:00:00 10 mg via ORAL

## 2017-12-08 MED ORDER — INSULIN ASPART 100 UNIT/ML SC FLEXPEN
5 [IU] | Freq: Three times a day (TID) | SUBCUTANEOUS | 0 refills | Status: DC
Start: 2017-12-08 — End: 2017-12-11

## 2017-12-09 LAB — POC GLUCOSE
Lab: 155 mg/dL — ABNORMAL HIGH (ref 70–100)
Lab: 159 mg/dL — ABNORMAL HIGH (ref 70–100)
Lab: 231 mg/dL — ABNORMAL HIGH (ref 70–100)
Lab: 242 mg/dL — ABNORMAL HIGH (ref 70–100)

## 2017-12-09 LAB — COMPREHENSIVE METABOLIC PANEL: Lab: 137 MMOL/L — ABNORMAL LOW (ref 137–147)

## 2017-12-09 LAB — PHOSPHORUS: Lab: 4.4 mg/dL — ABNORMAL HIGH (ref >60)

## 2017-12-09 LAB — MAGNESIUM: Lab: 1.9 mg/dL — ABNORMAL LOW (ref >60)

## 2017-12-09 LAB — CBC AND DIFF: Lab: 5.9 K/UL — ABNORMAL LOW (ref >60)

## 2017-12-09 MED ORDER — HEPARIN, PORCINE (PF) 5,000 UNIT/0.5 ML IJ SYRG
5000 [IU] | SUBCUTANEOUS | 0 refills | Status: DC
Start: 2017-12-09 — End: 2017-12-11
  Administered 2017-12-09 – 2017-12-10 (×4): 5000 [IU] via SUBCUTANEOUS

## 2017-12-10 ENCOUNTER — Inpatient Hospital Stay: Admit: 2017-12-04 | Discharge: 2017-12-04 | Payer: MEDICARE

## 2017-12-10 ENCOUNTER — Inpatient Hospital Stay: Admit: 2017-12-08 | Discharge: 2017-12-08 | Payer: MEDICARE

## 2017-12-10 ENCOUNTER — Inpatient Hospital Stay: Admit: 2017-12-07 | Discharge: 2017-12-07 | Payer: MEDICARE

## 2017-12-10 ENCOUNTER — Inpatient Hospital Stay
Admit: 2017-12-04 | Discharge: 2017-12-10 | Disposition: A | Discharge status: Skilled Nursing Facility | Payer: MEDICARE | Source: Other Acute Inpatient Hospital | Admitting: Critical Care Medicine

## 2017-12-10 DIAGNOSIS — K921 Melena: Principal | ICD-10-CM

## 2017-12-10 DIAGNOSIS — E11621 Type 2 diabetes mellitus with foot ulcer: ICD-10-CM

## 2017-12-10 DIAGNOSIS — Z87891 Personal history of nicotine dependence: ICD-10-CM

## 2017-12-10 DIAGNOSIS — D62 Acute posthemorrhagic anemia: ICD-10-CM

## 2017-12-10 DIAGNOSIS — Z79899 Other long term (current) drug therapy: ICD-10-CM

## 2017-12-10 DIAGNOSIS — I96 Gangrene, not elsewhere classified: ICD-10-CM

## 2017-12-10 DIAGNOSIS — K267 Chronic duodenal ulcer without hemorrhage or perforation: ICD-10-CM

## 2017-12-10 DIAGNOSIS — K219 Gastro-esophageal reflux disease without esophagitis: ICD-10-CM

## 2017-12-10 DIAGNOSIS — I5032 Chronic diastolic (congestive) heart failure: ICD-10-CM

## 2017-12-10 DIAGNOSIS — M868X7 Other osteomyelitis, ankle and foot: ICD-10-CM

## 2017-12-10 DIAGNOSIS — E1122 Type 2 diabetes mellitus with diabetic chronic kidney disease: ICD-10-CM

## 2017-12-10 DIAGNOSIS — E785 Hyperlipidemia, unspecified: ICD-10-CM

## 2017-12-10 DIAGNOSIS — Z794 Long term (current) use of insulin: ICD-10-CM

## 2017-12-10 DIAGNOSIS — L97429 Non-pressure chronic ulcer of left heel and midfoot with unspecified severity: ICD-10-CM

## 2017-12-10 DIAGNOSIS — K3189 Other diseases of stomach and duodenum: ICD-10-CM

## 2017-12-10 DIAGNOSIS — N184 Chronic kidney disease, stage 4 (severe): ICD-10-CM

## 2017-12-10 DIAGNOSIS — I864 Gastric varices: ICD-10-CM

## 2017-12-10 DIAGNOSIS — I130 Hypertensive heart and chronic kidney disease with heart failure and stage 1 through stage 4 chronic kidney disease, or unspecified chronic kidney disease: ICD-10-CM

## 2017-12-10 DIAGNOSIS — K861 Other chronic pancreatitis: ICD-10-CM

## 2017-12-10 DIAGNOSIS — I2510 Atherosclerotic heart disease of native coronary artery without angina pectoris: ICD-10-CM

## 2017-12-10 DIAGNOSIS — K920 Hematemesis: ICD-10-CM

## 2017-12-10 DIAGNOSIS — E1152 Type 2 diabetes mellitus with diabetic peripheral angiopathy with gangrene: ICD-10-CM

## 2017-12-10 DIAGNOSIS — E1165 Type 2 diabetes mellitus with hyperglycemia: ICD-10-CM

## 2017-12-10 DIAGNOSIS — Z9861 Coronary angioplasty status: ICD-10-CM

## 2017-12-10 DIAGNOSIS — E875 Hyperkalemia: ICD-10-CM

## 2017-12-10 DIAGNOSIS — K766 Portal hypertension: ICD-10-CM

## 2017-12-10 DIAGNOSIS — E1151 Type 2 diabetes mellitus with diabetic peripheral angiopathy without gangrene: ICD-10-CM

## 2017-12-10 DIAGNOSIS — I82891 Chronic embolism and thrombosis of other specified veins: ICD-10-CM

## 2017-12-10 LAB — BASIC METABOLIC PANEL: Lab: 138 MMOL/L — ABNORMAL LOW (ref 137–147)

## 2017-12-10 LAB — POC GLUCOSE
Lab: 159 mg/dL — ABNORMAL HIGH (ref 70–100)
Lab: 80 mg/dL (ref 70–100)
Lab: 114 mg/dL — ABNORMAL HIGH (ref 70–100)

## 2017-12-10 LAB — PHOSPHORUS: Lab: 4 mg/dL — ABNORMAL LOW (ref 2.0–4.5)

## 2017-12-10 LAB — MAGNESIUM: Lab: 2 mg/dL — ABNORMAL LOW (ref >60)

## 2017-12-10 LAB — CBC: Lab: 5.4 K/UL — ABNORMAL LOW (ref 4.5–11.0)

## 2017-12-10 MED ORDER — OXYCODONE 5 MG PO TAB
5-15 mg | ORAL | 0 refills | Status: SS | PRN
Start: 2017-12-10 — End: 2017-12-22

## 2017-12-10 MED ORDER — ACETAMINOPHEN 325 MG PO TAB
650 mg | ORAL | 0 refills | Status: AC | PRN
Start: 2017-12-10 — End: ?

## 2017-12-10 MED ORDER — INSULIN ASPART 100 UNIT/ML SC FLEXPEN
0-6 [IU] | Freq: Before meals | SUBCUTANEOUS | 0 refills | Status: DC
Start: 2017-12-10 — End: 2017-12-11

## 2017-12-10 MED ORDER — INSULIN GLARGINE 100 UNIT/ML (3 ML) SC INJ PEN
13 [IU] | Freq: Every evening | SUBCUTANEOUS | 3 refills | Status: SS
Start: 2017-12-10 — End: 2018-02-22

## 2017-12-10 MED ORDER — INSULIN ASPART 100 UNIT/ML SC FLEXPEN
5 [IU] | Freq: Three times a day (TID) | SUBCUTANEOUS | 3 refills | Status: SS
Start: 2017-12-10 — End: 2018-02-22

## 2017-12-10 MED ORDER — FUROSEMIDE 40 MG PO TAB
40 mg | ORAL_TABLET | Freq: Two times a day (BID) | ORAL | 3 refills | 90.00000 days | Status: AC
Start: 2017-12-10 — End: 2018-01-30

## 2017-12-11 ENCOUNTER — Encounter: Admit: 2017-12-11 | Discharge: 2017-12-11 | Payer: MEDICARE

## 2017-12-11 ENCOUNTER — Ambulatory Visit: Admit: 2017-12-11 | Discharge: 2017-12-11 | Payer: MEDICARE

## 2017-12-13 ENCOUNTER — Ambulatory Visit: Admit: 2017-12-13 | Discharge: 2017-12-13 | Payer: MEDICARE

## 2017-12-13 ENCOUNTER — Ambulatory Visit: Admit: 2017-12-13 | Discharge: 2017-12-14 | Payer: MEDICARE

## 2017-12-13 ENCOUNTER — Encounter: Admit: 2017-12-13 | Discharge: 2017-12-13 | Payer: MEDICARE

## 2017-12-13 DIAGNOSIS — F17200 Nicotine dependence, unspecified, uncomplicated: ICD-10-CM

## 2017-12-13 DIAGNOSIS — E119 Type 2 diabetes mellitus without complications: ICD-10-CM

## 2017-12-13 DIAGNOSIS — I10 Essential (primary) hypertension: Principal | ICD-10-CM

## 2017-12-13 DIAGNOSIS — E785 Hyperlipidemia, unspecified: ICD-10-CM

## 2017-12-13 DIAGNOSIS — K859 Acute pancreatitis without necrosis or infection, unspecified: ICD-10-CM

## 2017-12-13 DIAGNOSIS — R0600 Dyspnea, unspecified: ICD-10-CM

## 2017-12-14 ENCOUNTER — Encounter: Admit: 2017-12-14 | Discharge: 2017-12-14 | Payer: MEDICARE

## 2017-12-14 ENCOUNTER — Ambulatory Visit: Admit: 2017-12-14 | Discharge: 2017-12-14 | Payer: MEDICARE

## 2017-12-14 DIAGNOSIS — Z01818 Encounter for other preprocedural examination: ICD-10-CM

## 2017-12-14 DIAGNOSIS — E782 Mixed hyperlipidemia: ICD-10-CM

## 2017-12-14 DIAGNOSIS — I10 Essential (primary) hypertension: ICD-10-CM

## 2017-12-14 DIAGNOSIS — K8689 Other specified diseases of pancreas: ICD-10-CM

## 2017-12-14 DIAGNOSIS — K861 Other chronic pancreatitis: Principal | ICD-10-CM

## 2017-12-14 DIAGNOSIS — I739 Peripheral vascular disease, unspecified: ICD-10-CM

## 2017-12-14 DIAGNOSIS — Z9689 Presence of other specified functional implants: Principal | ICD-10-CM

## 2017-12-14 DIAGNOSIS — L97521 Non-pressure chronic ulcer of other part of left foot limited to breakdown of skin: ICD-10-CM

## 2017-12-14 DIAGNOSIS — I70213 Atherosclerosis of native arteries of extremities with intermittent claudication, bilateral legs: ICD-10-CM

## 2017-12-14 MED ORDER — SODIUM CHLORIDE 0.9 % IV SOLP
INTRAVENOUS | 0 refills | Status: CN
Start: 2017-12-14 — End: ?

## 2017-12-16 ENCOUNTER — Encounter: Admit: 2017-12-16 | Discharge: 2017-12-16 | Payer: MEDICARE

## 2017-12-16 DIAGNOSIS — R0600 Dyspnea, unspecified: ICD-10-CM

## 2017-12-16 DIAGNOSIS — K859 Acute pancreatitis without necrosis or infection, unspecified: ICD-10-CM

## 2017-12-16 DIAGNOSIS — I10 Essential (primary) hypertension: Principal | ICD-10-CM

## 2017-12-16 DIAGNOSIS — F17200 Nicotine dependence, unspecified, uncomplicated: ICD-10-CM

## 2017-12-16 DIAGNOSIS — E785 Hyperlipidemia, unspecified: ICD-10-CM

## 2017-12-16 DIAGNOSIS — E119 Type 2 diabetes mellitus without complications: ICD-10-CM

## 2017-12-17 ENCOUNTER — Encounter: Admit: 2017-12-17 | Discharge: 2017-12-17 | Payer: MEDICARE

## 2017-12-18 ENCOUNTER — Encounter: Admit: 2017-12-18 | Discharge: 2017-12-18 | Payer: MEDICARE

## 2017-12-18 ENCOUNTER — Ambulatory Visit: Admit: 2017-12-18 | Discharge: 2017-12-19 | Payer: MEDICARE

## 2017-12-18 ENCOUNTER — Ambulatory Visit: Admit: 2017-12-18 | Discharge: 2017-12-18 | Payer: MEDICARE

## 2017-12-18 ENCOUNTER — Emergency Department: Admit: 2017-12-18 | Discharge: 2017-12-18 | Payer: MEDICARE

## 2017-12-18 DIAGNOSIS — R0600 Dyspnea, unspecified: ICD-10-CM

## 2017-12-18 DIAGNOSIS — R0602 Shortness of breath: ICD-10-CM

## 2017-12-18 DIAGNOSIS — I10 Essential (primary) hypertension: ICD-10-CM

## 2017-12-18 DIAGNOSIS — D649 Anemia, unspecified: ICD-10-CM

## 2017-12-18 DIAGNOSIS — F17200 Nicotine dependence, unspecified, uncomplicated: ICD-10-CM

## 2017-12-18 DIAGNOSIS — E785 Hyperlipidemia, unspecified: ICD-10-CM

## 2017-12-18 DIAGNOSIS — K269 Duodenal ulcer, unspecified as acute or chronic, without hemorrhage or perforation: ICD-10-CM

## 2017-12-18 DIAGNOSIS — I5033 Acute on chronic diastolic (congestive) heart failure: Secondary | ICD-10-CM

## 2017-12-18 DIAGNOSIS — K859 Acute pancreatitis without necrosis or infection, unspecified: ICD-10-CM

## 2017-12-18 DIAGNOSIS — E119 Type 2 diabetes mellitus without complications: ICD-10-CM

## 2017-12-18 LAB — POC GLUCOSE
Lab: 192 mg/dL — ABNORMAL HIGH (ref 70–100)
Lab: 181 mg/dL — ABNORMAL HIGH (ref >60)

## 2017-12-18 LAB — COMPREHENSIVE METABOLIC PANEL
Lab: 0.3 mg/dL (ref 0.3–1.2)
Lab: 10 U/L (ref 7–40)
Lab: 105 MMOL/L — ABNORMAL LOW (ref 98–110)
Lab: 136 MMOL/L — ABNORMAL LOW (ref 137–147)
Lab: 186 mg/dL — ABNORMAL HIGH (ref 70–100)
Lab: 2.3 mg/dL — ABNORMAL HIGH (ref 0.4–1.24)
Lab: 2.7 g/dL — ABNORMAL LOW (ref 3.5–5.0)
Lab: 24 MMOL/L (ref 21–30)
Lab: 27 mL/min — ABNORMAL LOW (ref >60)
Lab: 33 mL/min — ABNORMAL LOW (ref >60)
Lab: 4.2 MMOL/L — ABNORMAL LOW (ref 3.5–5.1)
Lab: 5.5 g/dL — ABNORMAL LOW (ref 6.0–8.0)
Lab: 7 K/UL — ABNORMAL LOW (ref 3–12)
Lab: 8 U/L (ref 7–56)
Lab: 8.3 mg/dL — ABNORMAL LOW (ref 8.5–10.6)
Lab: 82 mg/dL — ABNORMAL HIGH (ref 7–25)
Lab: 91 U/L — ABNORMAL LOW (ref <=5.0)

## 2017-12-18 LAB — CBC AND DIFF
Lab: 0 10*3/uL (ref 0–0.20)
Lab: 0.1 10*3/uL (ref 0–0.45)
Lab: 7 10*3/uL — ABNORMAL LOW (ref 4.5–11.0)

## 2017-12-18 LAB — POC TROPONIN

## 2017-12-18 LAB — BNP POC ER: Lab: 893 pg/mL — ABNORMAL HIGH (ref 0–100)

## 2017-12-18 LAB — POC LACTATE: Lab: 0.7 MMOL/L (ref 0.5–2.0)

## 2017-12-18 MED ORDER — HYDRALAZINE 20 MG/ML IJ SOLN
5-10 mg | INTRAVENOUS | 0 refills | Status: DC | PRN
Start: 2017-12-18 — End: 2017-12-22

## 2017-12-18 MED ORDER — LACTULOSE 10 GRAM/15 ML PO SOLN
20 g | Freq: Three times a day (TID) | ORAL | 0 refills | Status: DC
Start: 2017-12-18 — End: 2017-12-22
  Administered 2017-12-19 – 2017-12-21 (×6): 20 g via ORAL

## 2017-12-18 MED ORDER — FUROSEMIDE 10 MG/ML IJ SOLN
40 mg | Freq: Once | INTRAVENOUS | 0 refills | Status: CP
Start: 2017-12-18 — End: ?
  Administered 2017-12-19: 03:00:00 40 mg via INTRAVENOUS

## 2017-12-18 MED ORDER — CITALOPRAM 40 MG PO TAB
40 mg | Freq: Every day | ORAL | 0 refills | Status: DC
Start: 2017-12-18 — End: 2017-12-22
  Administered 2017-12-19 – 2017-12-22 (×4): 40 mg via ORAL

## 2017-12-18 MED ORDER — FOLIC ACID 1 MG PO TAB
1 mg | Freq: Every day | ORAL | 0 refills | Status: DC
Start: 2017-12-18 — End: 2017-12-22
  Administered 2017-12-19 – 2017-12-22 (×4): 1 mg via ORAL

## 2017-12-18 MED ORDER — FUROSEMIDE 10 MG/ML IJ SOLN
60 mg | Freq: Two times a day (BID) | INTRAVENOUS | 0 refills | Status: DC
Start: 2017-12-18 — End: 2017-12-20
  Administered 2017-12-19 (×2): 60 mg via INTRAVENOUS

## 2017-12-18 MED ORDER — INSULIN ASPART 100 UNIT/ML SC FLEXPEN
5 [IU] | Freq: Three times a day (TID) | SUBCUTANEOUS | 0 refills | Status: DC
Start: 2017-12-18 — End: 2017-12-22
  Administered 2017-12-18: 5 [IU] via SUBCUTANEOUS

## 2017-12-18 MED ORDER — RIFAXIMIN 550 MG PO TAB
550 mg | Freq: Two times a day (BID) | ORAL | 0 refills | Status: DC
Start: 2017-12-18 — End: 2017-12-22
  Administered 2017-12-18 – 2017-12-22 (×8): 550 mg via ORAL

## 2017-12-18 MED ORDER — ACETAMINOPHEN 325 MG PO TAB
650 mg | ORAL | 0 refills | Status: DC | PRN
Start: 2017-12-18 — End: 2017-12-22
  Administered 2017-12-20: 15:00:00 650 mg via ORAL

## 2017-12-18 MED ORDER — ASPIRIN 81 MG PO TBEC
81 mg | Freq: Every day | ORAL | 0 refills | Status: DC
Start: 2017-12-18 — End: 2017-12-22
  Administered 2017-12-19 – 2017-12-22 (×2): 81 mg via ORAL

## 2017-12-18 MED ORDER — ENOXAPARIN 40 MG/0.4 ML SC SYRG
40 mg | Freq: Every day | SUBCUTANEOUS | 0 refills | Status: DC
Start: 2017-12-18 — End: 2017-12-20
  Administered 2017-12-19 – 2017-12-20 (×2): 40 mg via SUBCUTANEOUS

## 2017-12-18 MED ORDER — INSULIN GLARGINE 100 UNIT/ML (3 ML) SC INJ PEN
13 [IU] | Freq: Every evening | SUBCUTANEOUS | 0 refills | Status: DC
Start: 2017-12-18 — End: 2017-12-22
  Administered 2017-12-19: 03:00:00 13 [IU] via SUBCUTANEOUS

## 2017-12-18 MED ORDER — AMLODIPINE 10 MG PO TAB
10 mg | Freq: Every day | ORAL | 0 refills | Status: DC
Start: 2017-12-18 — End: 2017-12-22
  Administered 2017-12-19 – 2017-12-22 (×4): 10 mg via ORAL

## 2017-12-18 MED ORDER — ROPINIROLE 2 MG PO TAB
4 mg | Freq: Every evening | ORAL | 0 refills | Status: DC
Start: 2017-12-18 — End: 2017-12-22
  Administered 2017-12-19 – 2017-12-22 (×4): 4 mg via ORAL

## 2017-12-18 MED ORDER — PANTOPRAZOLE 40 MG PO TBEC
40 mg | Freq: Two times a day (BID) | ORAL | 0 refills | Status: DC
Start: 2017-12-18 — End: 2017-12-20
  Administered 2017-12-19 – 2017-12-20 (×3): 40 mg via ORAL

## 2017-12-18 MED ORDER — CARVEDILOL 25 MG PO TAB
25 mg | Freq: Two times a day (BID) | ORAL | 0 refills | Status: DC
Start: 2017-12-18 — End: 2017-12-22
  Administered 2017-12-19 – 2017-12-22 (×8): 25 mg via ORAL

## 2017-12-18 MED ORDER — FUROSEMIDE 10 MG/ML IJ SOLN
40 mg | Freq: Once | INTRAVENOUS | 0 refills | Status: CP
Start: 2017-12-18 — End: ?
  Administered 2017-12-18: 20:00:00 40 mg via INTRAVENOUS

## 2017-12-18 MED ORDER — INSULIN ASPART 100 UNIT/ML SC FLEXPEN
0-6 [IU] | Freq: Before meals | SUBCUTANEOUS | 0 refills | Status: DC
Start: 2017-12-18 — End: 2017-12-22

## 2017-12-18 MED ORDER — OXYCODONE 5 MG PO TAB
5-15 mg | ORAL | 0 refills | Status: DC | PRN
Start: 2017-12-18 — End: 2017-12-22
  Administered 2017-12-19 (×2): 15 mg via ORAL
  Administered 2017-12-19 – 2017-12-20 (×3): 10 mg via ORAL
  Administered 2017-12-20: 04:00:00 15 mg via ORAL
  Administered 2017-12-21 (×3): 10 mg via ORAL
  Administered 2017-12-21: 03:00:00 5 mg via ORAL
  Administered 2017-12-22: 03:00:00 15 mg via ORAL
  Administered 2017-12-22: 02:00:00 5 mg via ORAL

## 2017-12-18 MED ORDER — ATORVASTATIN 40 MG PO TAB
40 mg | Freq: Every day | ORAL | 0 refills | Status: DC
Start: 2017-12-18 — End: 2017-12-22
  Administered 2017-12-19 – 2017-12-22 (×5): 40 mg via ORAL

## 2017-12-18 MED ORDER — FERROUS SULFATE 325 MG (65 MG IRON) PO TAB
650 mg | Freq: Three times a day (TID) | ORAL | 0 refills | Status: DC
Start: 2017-12-18 — End: 2017-12-22
  Administered 2017-12-18 – 2017-12-22 (×11): 650 mg via ORAL

## 2017-12-19 LAB — MAGNESIUM: Lab: 2.2 mg/dL — ABNORMAL LOW (ref 1.6–2.6)

## 2017-12-19 LAB — POC GLUCOSE
Lab: 272 mg/dL — ABNORMAL HIGH (ref 70–100)
Lab: 130 mg/dL — ABNORMAL HIGH (ref 70–100)
Lab: 183 mg/dL — ABNORMAL HIGH (ref 70–100)
Lab: 184 mg/dL — ABNORMAL HIGH (ref 70–100)

## 2017-12-19 LAB — CBC AND DIFF: Lab: 5.5 K/UL — ABNORMAL HIGH (ref 4.5–11.0)

## 2017-12-19 LAB — COMPREHENSIVE METABOLIC PANEL: Lab: 137 MMOL/L — ABNORMAL LOW (ref 137–147)

## 2017-12-19 MED ORDER — ONDANSETRON HCL (PF) 4 MG/2 ML IJ SOLN
4 mg | INTRAVENOUS | 0 refills | Status: DC | PRN
Start: 2017-12-19 — End: 2017-12-22
  Administered 2017-12-20: 02:00:00 4 mg via INTRAVENOUS

## 2017-12-20 ENCOUNTER — Encounter: Admit: 2017-12-20 | Discharge: 2017-12-20 | Payer: MEDICARE

## 2017-12-20 DIAGNOSIS — R0602 Shortness of breath: ICD-10-CM

## 2017-12-20 LAB — COMPREHENSIVE METABOLIC PANEL: Lab: 136 MMOL/L — ABNORMAL LOW (ref 137–147)

## 2017-12-20 LAB — HEMOGLOBIN & HEMATOCRIT
Lab: 17 % — ABNORMAL LOW (ref 40–50)
Lab: 6.2 g/dL — ABNORMAL LOW (ref 13.5–16.5)
Lab: 19 % — ABNORMAL LOW (ref 40–50)
Lab: 6.7 g/dL — ABNORMAL LOW (ref 13.5–16.5)

## 2017-12-20 LAB — POC GLUCOSE
Lab: 229 mg/dL — ABNORMAL HIGH (ref 70–100)
Lab: 203 mg/dL — ABNORMAL HIGH (ref 70–100)
Lab: 186 mg/dL — ABNORMAL HIGH (ref 70–100)

## 2017-12-20 LAB — CBC AND DIFF: Lab: 6.2 K/UL — ABNORMAL HIGH (ref 4.5–11.0)

## 2017-12-20 LAB — MAGNESIUM: Lab: 2.1 mg/dL (ref >60)

## 2017-12-20 MED ORDER — LIDOCAINE (PF) 200 MG/10 ML (2 %) IJ SYRG
0 refills | Status: DC
Start: 2017-12-20 — End: 2017-12-20
  Administered 2017-12-20: 21:00:00 80 mg via INTRAVENOUS

## 2017-12-20 MED ORDER — LACTATED RINGERS IV SOLP
INTRAVENOUS | 0 refills | Status: CN
Start: 2017-12-20 — End: ?

## 2017-12-20 MED ORDER — PANTOPRAZOLE 40 MG IV SOLR
40 mg | Freq: Two times a day (BID) | INTRAVENOUS | 0 refills | Status: DC
Start: 2017-12-20 — End: 2017-12-22
  Administered 2017-12-20 – 2017-12-22 (×5): 40 mg via INTRAVENOUS

## 2017-12-20 MED ORDER — PROPOFOL 10 MG/ML IV EMUL 20 ML (INFUSION)(AM)(OR)
INTRAVENOUS | 0 refills | Status: DC
Start: 2017-12-20 — End: 2017-12-20

## 2017-12-20 MED ORDER — LACTATED RINGERS IV SOLP
0 refills | Status: DC
Start: 2017-12-20 — End: 2017-12-20
  Administered 2017-12-20: 21:00:00 via INTRAVENOUS

## 2017-12-20 MED ORDER — SODIUM CHLORIDE 0.9 % IV SOLP
1000 mL | Freq: Once | INTRAVENOUS | 0 refills | Status: AC
Start: 2017-12-20 — End: ?

## 2017-12-20 MED ORDER — PROPOFOL INJ 10 MG/ML IV VIAL
0 refills | Status: DC
Start: 2017-12-20 — End: 2017-12-20
  Administered 2017-12-20: 21:00:00 30 mg via INTRAVENOUS
  Administered 2017-12-20: 21:00:00 40 mg via INTRAVENOUS
  Administered 2017-12-20: 21:00:00 30 mg via INTRAVENOUS

## 2017-12-21 ENCOUNTER — Encounter: Admit: 2017-12-21 | Discharge: 2017-12-21 | Payer: MEDICARE

## 2017-12-21 DIAGNOSIS — K859 Acute pancreatitis without necrosis or infection, unspecified: ICD-10-CM

## 2017-12-21 DIAGNOSIS — E785 Hyperlipidemia, unspecified: ICD-10-CM

## 2017-12-21 DIAGNOSIS — R0600 Dyspnea, unspecified: ICD-10-CM

## 2017-12-21 DIAGNOSIS — R0602 Shortness of breath: ICD-10-CM

## 2017-12-21 DIAGNOSIS — E119 Type 2 diabetes mellitus without complications: ICD-10-CM

## 2017-12-21 DIAGNOSIS — I10 Essential (primary) hypertension: Principal | ICD-10-CM

## 2017-12-21 DIAGNOSIS — F17200 Nicotine dependence, unspecified, uncomplicated: ICD-10-CM

## 2017-12-21 LAB — HEMOGLOBIN & HEMATOCRIT
Lab: 21 % — ABNORMAL LOW (ref 40–50)
Lab: 7 g/dL — ABNORMAL LOW (ref 13.5–16.5)
Lab: 7.2 g/dL — ABNORMAL LOW (ref 13.5–16.5)
Lab: 23 % — ABNORMAL LOW (ref 40–50)
Lab: 8 g/dL — ABNORMAL LOW (ref 13.5–16.5)

## 2017-12-21 LAB — POC GLUCOSE
Lab: 217 mg/dL — ABNORMAL HIGH (ref 70–100)
Lab: 179 mg/dL — ABNORMAL HIGH (ref 70–100)
Lab: 156 mg/dL — ABNORMAL HIGH (ref 70–100)
Lab: 142 mg/dL — ABNORMAL HIGH (ref 70–100)

## 2017-12-21 LAB — CBC AND DIFF: Lab: 7.3 K/UL — ABNORMAL LOW (ref >60)

## 2017-12-21 LAB — MAGNESIUM: Lab: 2.1 mg/dL — ABNORMAL HIGH (ref >60)

## 2017-12-21 LAB — COMPREHENSIVE METABOLIC PANEL: Lab: 137 MMOL/L — ABNORMAL LOW (ref 137–147)

## 2017-12-22 ENCOUNTER — Inpatient Hospital Stay
Admit: 2017-12-18 | Discharge: 2017-12-22 | Disposition: A | Discharge status: Skilled Nursing Facility | Payer: MEDICARE

## 2017-12-22 ENCOUNTER — Inpatient Hospital Stay: Admit: 2017-12-20 | Discharge: 2017-12-20 | Payer: MEDICARE

## 2017-12-22 ENCOUNTER — Inpatient Hospital Stay: Admit: 2017-12-21 | Discharge: 2017-12-21 | Payer: MEDICARE

## 2017-12-22 ENCOUNTER — Inpatient Hospital Stay: Admit: 2017-12-20 | Discharge: 2017-12-21 | Payer: MEDICARE

## 2017-12-22 DIAGNOSIS — L089 Local infection of the skin and subcutaneous tissue, unspecified: ICD-10-CM

## 2017-12-22 DIAGNOSIS — Z794 Long term (current) use of insulin: ICD-10-CM

## 2017-12-22 DIAGNOSIS — Z87891 Personal history of nicotine dependence: ICD-10-CM

## 2017-12-22 DIAGNOSIS — K766 Portal hypertension: ICD-10-CM

## 2017-12-22 DIAGNOSIS — E1152 Type 2 diabetes mellitus with diabetic peripheral angiopathy with gangrene: ICD-10-CM

## 2017-12-22 DIAGNOSIS — E785 Hyperlipidemia, unspecified: ICD-10-CM

## 2017-12-22 DIAGNOSIS — K219 Gastro-esophageal reflux disease without esophagitis: ICD-10-CM

## 2017-12-22 DIAGNOSIS — K264 Chronic or unspecified duodenal ulcer with hemorrhage: ICD-10-CM

## 2017-12-22 DIAGNOSIS — I130 Hypertensive heart and chronic kidney disease with heart failure and stage 1 through stage 4 chronic kidney disease, or unspecified chronic kidney disease: Principal | ICD-10-CM

## 2017-12-22 DIAGNOSIS — E11628 Type 2 diabetes mellitus with other skin complications: ICD-10-CM

## 2017-12-22 DIAGNOSIS — Z955 Presence of coronary angioplasty implant and graft: ICD-10-CM

## 2017-12-22 DIAGNOSIS — I2510 Atherosclerotic heart disease of native coronary artery without angina pectoris: ICD-10-CM

## 2017-12-22 DIAGNOSIS — I864 Gastric varices: ICD-10-CM

## 2017-12-22 DIAGNOSIS — E1122 Type 2 diabetes mellitus with diabetic chronic kidney disease: Secondary | ICD-10-CM

## 2017-12-22 DIAGNOSIS — K861 Other chronic pancreatitis: ICD-10-CM

## 2017-12-22 DIAGNOSIS — I5033 Acute on chronic diastolic (congestive) heart failure: ICD-10-CM

## 2017-12-22 DIAGNOSIS — N184 Chronic kidney disease, stage 4 (severe): ICD-10-CM

## 2017-12-22 DIAGNOSIS — D62 Acute posthemorrhagic anemia: ICD-10-CM

## 2017-12-22 DIAGNOSIS — I5083 High output heart failure: ICD-10-CM

## 2017-12-22 LAB — POC GLUCOSE
Lab: 163 mg/dL — ABNORMAL HIGH (ref 70–100)
Lab: 112 mg/dL — ABNORMAL HIGH (ref 70–100)
Lab: 108 mg/dL — ABNORMAL HIGH (ref 70–100)
Lab: 50 mg/dL — ABNORMAL LOW (ref 70–100)
Lab: 55 mg/dL — ABNORMAL LOW (ref 70–100)
Lab: 76 mg/dL (ref 70–100)
Lab: 89 mg/dL (ref 70–100)

## 2017-12-22 LAB — COMPREHENSIVE METABOLIC PANEL: Lab: 138 MMOL/L — ABNORMAL LOW (ref >60)

## 2017-12-22 LAB — MAGNESIUM: Lab: 2 mg/dL — ABNORMAL LOW (ref 1.6–2.6)

## 2017-12-22 LAB — CBC AND DIFF: Lab: 7.2 K/UL — ABNORMAL LOW (ref 4.5–11.0)

## 2017-12-22 MED ORDER — DEXTRAN 70-HYPROMELLOSE 0.1-0.3 % OP DROP
1 [drp] | Freq: Four times a day (QID) | OPHTHALMIC | 0 refills | Status: DC
Start: 2017-12-22 — End: 2017-12-22
  Administered 2017-12-22: 08:00:00 1 [drp] via OPHTHALMIC

## 2017-12-22 MED ORDER — OXYCODONE 5 MG PO TAB
5-15 mg | ORAL_TABLET | ORAL | 0 refills | 6.00000 days | Status: AC | PRN
Start: 2017-12-22 — End: 2018-01-14

## 2017-12-24 ENCOUNTER — Ambulatory Visit: Admit: 2017-12-24 | Discharge: 2017-12-24 | Payer: MEDICARE

## 2017-12-25 ENCOUNTER — Ambulatory Visit: Admit: 2017-12-25 | Discharge: 2017-12-25 | Payer: MEDICARE

## 2017-12-27 ENCOUNTER — Encounter: Admit: 2017-12-27 | Discharge: 2017-12-27 | Payer: MEDICARE

## 2017-12-27 ENCOUNTER — Inpatient Hospital Stay: Admit: 2017-12-27 | Discharge: 2017-12-27 | Payer: MEDICARE

## 2017-12-27 DIAGNOSIS — E11628 Type 2 diabetes mellitus with other skin complications: ICD-10-CM

## 2017-12-27 DIAGNOSIS — L02612 Cutaneous abscess of left foot: Secondary | ICD-10-CM

## 2017-12-27 DIAGNOSIS — N184 Chronic kidney disease, stage 4 (severe): Secondary | ICD-10-CM

## 2017-12-27 DIAGNOSIS — K861 Other chronic pancreatitis: Secondary | ICD-10-CM

## 2017-12-27 DIAGNOSIS — R0600 Dyspnea, unspecified: ICD-10-CM

## 2017-12-27 DIAGNOSIS — I10 Essential (primary) hypertension: Principal | ICD-10-CM

## 2017-12-27 DIAGNOSIS — K859 Acute pancreatitis without necrosis or infection, unspecified: ICD-10-CM

## 2017-12-27 DIAGNOSIS — I864 Gastric varices: Secondary | ICD-10-CM

## 2017-12-27 DIAGNOSIS — E785 Hyperlipidemia, unspecified: ICD-10-CM

## 2017-12-27 DIAGNOSIS — E119 Type 2 diabetes mellitus without complications: ICD-10-CM

## 2017-12-27 DIAGNOSIS — F17200 Nicotine dependence, unspecified, uncomplicated: ICD-10-CM

## 2017-12-27 DIAGNOSIS — I739 Peripheral vascular disease, unspecified: ICD-10-CM

## 2017-12-27 LAB — COMPREHENSIVE METABOLIC PANEL
Lab: 0.3 mg/dL — ABNORMAL HIGH (ref 0.3–1.2)
Lab: 10 U/L — ABNORMAL HIGH (ref 7–56)
Lab: 136 MMOL/L — ABNORMAL LOW (ref 137–147)
Lab: 15 U/L (ref 7–40)
Lab: 2.2 mg/dL — ABNORMAL HIGH (ref 0.4–1.24)
Lab: 2.6 g/dL — ABNORMAL LOW (ref 3.5–5.0)
Lab: 23 MMOL/L (ref 21–30)
Lab: 29 mL/min — ABNORMAL LOW (ref >60)
Lab: 36 mL/min — ABNORMAL LOW (ref >60)
Lab: 5 g/dL — ABNORMAL LOW (ref 6.0–8.0)
Lab: 5.1 MMOL/L — ABNORMAL LOW (ref 3.5–5.1)
Lab: 62 mg/dL — ABNORMAL HIGH (ref 7–25)
Lab: 7 K/UL — ABNORMAL LOW (ref 3–12)
Lab: 7.4 mg/dL — ABNORMAL LOW (ref 8.5–10.6)
Lab: 92 U/L (ref 25–110)

## 2017-12-27 LAB — PROTIME INR (PT): Lab: 1.2 M/UL — ABNORMAL LOW (ref >60)

## 2017-12-27 LAB — POC GLUCOSE: Lab: 247 mg/dL — ABNORMAL HIGH (ref 70–100)

## 2017-12-27 LAB — CBC AND DIFF
Lab: 0 10*3/uL (ref 0–0.20)
Lab: 9.1 10*3/uL (ref >60)

## 2017-12-27 LAB — PHOSPHORUS: Lab: 5.3 mg/dL — ABNORMAL HIGH (ref 2.0–4.5)

## 2017-12-27 LAB — MAGNESIUM: Lab: 1.8 mg/dL (ref 1.6–2.6)

## 2017-12-27 MED ORDER — OXYCODONE 5 MG PO TAB
5-15 mg | ORAL | 0 refills | Status: DC | PRN
Start: 2017-12-27 — End: 2017-12-28
  Administered 2017-12-27 – 2017-12-28 (×2): 5 mg via ORAL
  Administered 2017-12-28: 10 mg via ORAL

## 2017-12-27 MED ORDER — OXYCODONE 5 MG PO TAB
5-15 mg | ORAL | 0 refills | Status: DC | PRN
Start: 2017-12-27 — End: 2018-01-03
  Administered 2017-12-28 – 2017-12-30 (×8): 15 mg via ORAL
  Administered 2017-12-30 (×2): 10 mg via ORAL
  Administered 2017-12-31: 13:00:00 5 mg via ORAL
  Administered 2017-12-31: 10:00:00 10 mg via ORAL
  Administered 2017-12-31 (×2): 15 mg via ORAL
  Administered 2017-12-31: 01:00:00 10 mg via ORAL
  Administered 2017-12-31 – 2018-01-03 (×14): 15 mg via ORAL

## 2017-12-27 MED ORDER — ASPIRIN 81 MG PO CHEW
81 mg | Freq: Every day | ORAL | 0 refills | Status: DC
Start: 2017-12-27 — End: 2017-12-28

## 2017-12-27 MED ORDER — FOLIC ACID 1 MG PO TAB
1 mg | Freq: Every day | ORAL | 0 refills | Status: DC
Start: 2017-12-27 — End: 2018-01-14
  Administered 2017-12-29 – 2018-01-13 (×15): 1 mg via ORAL

## 2017-12-27 MED ORDER — RIFAXIMIN 550 MG PO TAB
550 mg | Freq: Two times a day (BID) | ORAL | 0 refills | Status: DC
Start: 2017-12-27 — End: 2018-01-14
  Administered 2017-12-28 – 2018-01-14 (×33): 550 mg via ORAL

## 2017-12-27 MED ORDER — NITROGLYCERIN 0.4 MG SL SUBL
.4 mg | SUBLINGUAL | 0 refills | Status: DC | PRN
Start: 2017-12-27 — End: 2018-01-14

## 2017-12-27 MED ORDER — ROPINIROLE 1 MG PO TAB
4 mg | Freq: Every evening | ORAL | 0 refills | Status: DC
Start: 2017-12-27 — End: 2018-01-14
  Administered 2017-12-28 – 2018-01-14 (×18): 4 mg via ORAL

## 2017-12-27 MED ORDER — ONDANSETRON HCL (PF) 4 MG/2 ML IJ SOLN
4-8 mg | INTRAVENOUS | 0 refills | Status: DC | PRN
Start: 2017-12-27 — End: 2018-01-14
  Administered 2017-12-28 – 2018-01-09 (×6): 4 mg via INTRAVENOUS

## 2017-12-27 MED ORDER — OXYCODONE 5 MG PO TAB
5-10 mg | ORAL | 0 refills | Status: DC | PRN
Start: 2017-12-27 — End: 2017-12-27
  Administered 2017-12-27: 20:00:00 10 mg via ORAL

## 2017-12-27 MED ORDER — FERROUS SULFATE 325 MG (65 MG IRON) PO TAB
650 mg | Freq: Three times a day (TID) | ORAL | 0 refills | Status: DC
Start: 2017-12-27 — End: 2018-01-14
  Administered 2017-12-27 – 2018-01-14 (×50): 650 mg via ORAL

## 2017-12-27 MED ORDER — ATORVASTATIN 40 MG PO TAB
40 mg | Freq: Every day | ORAL | 0 refills | Status: DC
Start: 2017-12-27 — End: 2018-01-14
  Administered 2017-12-29 – 2018-01-14 (×16): 40 mg via ORAL

## 2017-12-27 MED ORDER — ACETAMINOPHEN 325 MG PO TAB
650 mg | ORAL | 0 refills | Status: DC | PRN
Start: 2017-12-27 — End: 2018-01-09
  Administered 2017-12-27 – 2018-01-09 (×23): 650 mg via ORAL

## 2017-12-27 MED ORDER — ONDANSETRON HCL (PF) 4 MG/2 ML IJ SOLN
4 mg | INTRAVENOUS | 0 refills | Status: DC | PRN
Start: 2017-12-27 — End: 2017-12-28
  Administered 2017-12-28: 03:00:00 4 mg via INTRAVENOUS

## 2017-12-27 MED ORDER — INSULIN ASPART 100 UNIT/ML SC FLEXPEN
0-6 [IU] | Freq: Before meals | SUBCUTANEOUS | 0 refills | Status: DC
Start: 2017-12-27 — End: 2017-12-28
  Administered 2017-12-27: 2 [IU] via SUBCUTANEOUS

## 2017-12-27 MED ORDER — PANTOPRAZOLE 40 MG PO TBEC
40 mg | Freq: Two times a day (BID) | ORAL | 0 refills | Status: DC
Start: 2017-12-27 — End: 2017-12-28
  Administered 2017-12-28: 02:00:00 40 mg via ORAL

## 2017-12-27 MED ORDER — PIPERACILLIN/TAZOBACTAM 2.25 G/NS IVPB (MB+)
2.25 g | INTRAVENOUS | 0 refills | Status: DC
Start: 2017-12-27 — End: 2018-01-03
  Administered 2017-12-27 – 2018-01-03 (×58): 2.25 g via INTRAVENOUS

## 2017-12-27 MED ORDER — CARVEDILOL 25 MG PO TAB
25 mg | Freq: Two times a day (BID) | ORAL | 0 refills | Status: DC
Start: 2017-12-27 — End: 2018-01-14
  Administered 2017-12-28 – 2018-01-14 (×34): 25 mg via ORAL

## 2017-12-28 ENCOUNTER — Encounter: Admit: 2017-12-28 | Discharge: 2017-12-28 | Payer: MEDICARE

## 2017-12-28 ENCOUNTER — Inpatient Hospital Stay: Admit: 2017-12-28 | Discharge: 2017-12-28 | Payer: MEDICARE

## 2017-12-28 DIAGNOSIS — L02612 Cutaneous abscess of left foot: Secondary | ICD-10-CM

## 2017-12-28 LAB — MAGNESIUM
Lab: 1.9 mg/dL — ABNORMAL LOW (ref 1.6–2.6)
Lab: 1.9 mg/dL — ABNORMAL HIGH (ref 1.6–2.6)

## 2017-12-28 LAB — BASIC METABOLIC PANEL
Lab: 137 MMOL/L — ABNORMAL LOW (ref >60)
Lab: 138 MMOL/L (ref 137–147)
Lab: 2.6 mg/dL — ABNORMAL HIGH (ref 0.4–1.24)
Lab: 22 MMOL/L (ref 21–30)
Lab: 24 mL/min — ABNORMAL LOW (ref >60)
Lab: 29 mL/min — ABNORMAL LOW (ref >60)
Lab: 328 mg/dL — ABNORMAL HIGH (ref 70–100)
Lab: 6.9 mg/dL — ABNORMAL LOW (ref 8.5–10.6)
Lab: 7 (ref 3–12)
Lab: 90 mg/dL — ABNORMAL HIGH (ref 7–25)

## 2017-12-28 LAB — PHOSPHORUS
Lab: 5.8 mg/dL — ABNORMAL HIGH (ref 2.0–4.5)
Lab: 5.6 mg/dL — ABNORMAL HIGH (ref 2.0–4.5)

## 2017-12-28 LAB — POC GLUCOSE
Lab: 289 mg/dL — ABNORMAL HIGH (ref 70–100)
Lab: 301 mg/dL — ABNORMAL HIGH (ref 70–100)
Lab: 285 mg/dL — ABNORMAL HIGH (ref 70–100)
Lab: 346 mg/dL — ABNORMAL HIGH (ref 70–100)
Lab: 335 mg/dL — ABNORMAL HIGH (ref 70–100)
Lab: 290 mg/dL — ABNORMAL HIGH (ref 70–100)
Lab: 274 mg/dL — ABNORMAL HIGH (ref 70–100)
Lab: 235 mg/dL — ABNORMAL HIGH (ref 70–100)
Lab: 210 mg/dL — ABNORMAL HIGH (ref 70–100)
Lab: 172 mg/dL — ABNORMAL HIGH (ref 70–100)

## 2017-12-28 LAB — CBC
Lab: 7.2 K/UL — ABNORMAL LOW (ref >60)
Lab: 140 10*3/uL — ABNORMAL LOW (ref 150–400)
Lab: 16 % — ABNORMAL HIGH (ref 11–15)
Lab: 18 % — ABNORMAL LOW (ref 40–50)
Lab: 2.1 M/UL — ABNORMAL LOW (ref 4.4–5.5)
Lab: 29 pg (ref 26–34)
Lab: 35 g/dL (ref 32.0–36.0)
Lab: 6.4 g/dL — ABNORMAL LOW (ref 13.5–16.5)
Lab: 8.4 10*3/uL (ref 4.5–11.0)
Lab: 85 FL (ref 80–100)
Lab: 9.5 FL (ref 7–11)

## 2017-12-28 MED ORDER — MORPHINE 4 MG/ML IV CRTG
2-4 mg | INTRAVENOUS | 0 refills | Status: DC | PRN
Start: 2017-12-28 — End: 2017-12-28
  Administered 2017-12-28 (×3): 2 mg via INTRAVENOUS

## 2017-12-28 MED ORDER — INSULIN REGULAR HUMAN(#) 1 UNIT/ML IJ SYRINGE
10 [IU] | Freq: Once | INTRAVENOUS | 0 refills | Status: CP
Start: 2017-12-28 — End: ?
  Administered 2017-12-28: 14:00:00 10 [IU] via INTRAVENOUS

## 2017-12-28 MED ORDER — PANTOPRAZOLE 40 MG IV SOLR
40 mg | Freq: Two times a day (BID) | INTRAVENOUS | 0 refills | Status: DC
Start: 2017-12-28 — End: 2018-01-14
  Administered 2017-12-28 – 2018-01-14 (×33): 40 mg via INTRAVENOUS

## 2017-12-28 MED ORDER — SODIUM CHLORIDE 0.9 % IV SOLP
0 refills | Status: DC
Start: 2017-12-28 — End: 2017-12-28
  Administered 2017-12-28: 21:00:00 via INTRAVENOUS

## 2017-12-28 MED ORDER — PROPOFOL 10 MG/ML IV EMUL 20 ML (INFUSION)(AM)(OR)
INTRAVENOUS | 0 refills | Status: DC
Start: 2017-12-28 — End: 2017-12-28
  Administered 2017-12-28: 21:00:00 30 ug/kg/min via INTRAVENOUS

## 2017-12-28 MED ORDER — FENTANYL CITRATE (PF) 50 MCG/ML IJ SOLN
0 refills | Status: DC
Start: 2017-12-28 — End: 2017-12-28
  Administered 2017-12-28 (×2): 25 ug via INTRAVENOUS

## 2017-12-28 MED ORDER — INSULIN 100UNITS NS 100ML
1-32 [IU]/h | INTRAVENOUS | 0 refills | Status: DC
Start: 2017-12-28 — End: 2017-12-29
  Administered 2017-12-28 (×2): 3.5 [IU]/h via INTRAVENOUS

## 2017-12-28 MED ORDER — CALCIUM GLUCONATE 1GM/100ML NS MB+
1 g | Freq: Once | INTRAVENOUS | 0 refills | Status: CP
Start: 2017-12-28 — End: ?
  Administered 2017-12-28 (×2): 1 g via INTRAVENOUS

## 2017-12-28 MED ORDER — DEXTROSE 50 % IN WATER (D50W) IV SYRG
50 mL | Freq: Once | INTRAVENOUS | 0 refills | Status: CP
Start: 2017-12-28 — End: ?
  Administered 2017-12-28: 14:00:00 50 mL via INTRAVENOUS

## 2017-12-28 MED ORDER — DEXMEDETOMIDINE# 4MCG/ML IV SOLN
0 refills | Status: DC
Start: 2017-12-28 — End: 2017-12-28
  Administered 2017-12-28 (×5): 4 ug via INTRAVENOUS

## 2017-12-28 MED ORDER — LIDOCAINE (PF) 200 MG/10 ML (2 %) IJ SYRG
0 refills | Status: DC
Start: 2017-12-28 — End: 2017-12-28
  Administered 2017-12-28: 19:00:00 40 mg via INTRAVENOUS

## 2017-12-28 MED ORDER — SODIUM CHLORIDE 0.9 % IV SOLP
INTRAVENOUS | 0 refills | Status: CN
Start: 2017-12-28 — End: ?

## 2017-12-28 MED ORDER — SODIUM CHLORIDE 0.9 % IV SOLP
0 refills | Status: DC
Start: 2017-12-28 — End: 2017-12-28
  Administered 2017-12-28: 19:00:00 via INTRAVENOUS

## 2017-12-28 MED ORDER — PROPOFOL INJ 10 MG/ML IV VIAL
0 refills | Status: DC
Start: 2017-12-28 — End: 2017-12-28
  Administered 2017-12-28: 19:00:00 20 mg via INTRAVENOUS
  Administered 2017-12-28: 19:00:00 30 mg via INTRAVENOUS
  Administered 2017-12-28: 19:00:00 20 mg via INTRAVENOUS
  Administered 2017-12-28: 19:00:00 50 mg via INTRAVENOUS
  Administered 2017-12-28: 19:00:00 20 mg via INTRAVENOUS
  Administered 2017-12-28: 19:00:00 10 mg via INTRAVENOUS

## 2017-12-28 MED ORDER — INSULIN ASPART 100 UNIT/ML SC FLEXPEN
0-12 [IU] | Freq: Every day | SUBCUTANEOUS | 0 refills | Status: DC
Start: 2017-12-28 — End: 2017-12-28

## 2017-12-28 MED ORDER — PEG-ELECTROLYTE SOLN 420 GRAM PO SOLR
4 L | Freq: Once | ORAL | 0 refills | Status: CP
Start: 2017-12-28 — End: ?
  Administered 2017-12-30: 06:00:00 4 L via ORAL

## 2017-12-28 MED ORDER — FENTANYL CITRATE (PF) 50 MCG/ML IJ SOLN
25-50 ug | INTRAVENOUS | 0 refills | Status: DC | PRN
Start: 2017-12-28 — End: 2017-12-30
  Administered 2017-12-28: 19:00:00 25 ug via INTRAVENOUS
  Administered 2017-12-28 – 2017-12-30 (×6): 50 ug via INTRAVENOUS

## 2017-12-29 LAB — CBC
Lab: 8.7 10*3/uL — ABNORMAL LOW (ref 4.5–11.0)
Lab: 11 K/UL — ABNORMAL HIGH (ref 4.5–11.0)
Lab: 10 K/UL (ref 4.5–11.0)
Lab: 16 % — ABNORMAL HIGH (ref 11–15)
Lab: 24 % — ABNORMAL LOW (ref >60)
Lab: 28 pg — ABNORMAL HIGH (ref 26–34)
Lab: 7.7 g/dL — ABNORMAL LOW (ref 13.5–16.5)
Lab: 9.8 FL (ref 7–11)
Lab: 2.5 M/UL — ABNORMAL LOW (ref 4.4–5.5)
Lab: 22 % — ABNORMAL LOW (ref 40–50)
Lab: 8.7 10*3/uL — ABNORMAL HIGH (ref 4.5–11.0)

## 2017-12-29 LAB — POC GLUCOSE
Lab: 118 mg/dL — ABNORMAL HIGH (ref 70–100)
Lab: 123 mg/dL — ABNORMAL HIGH (ref 70–100)
Lab: 124 mg/dL — ABNORMAL HIGH (ref 70–100)
Lab: 130 mg/dL — ABNORMAL HIGH (ref 70–100)
Lab: 130 mg/dL — ABNORMAL HIGH (ref 70–100)
Lab: 131 mg/dL — ABNORMAL HIGH (ref 70–100)
Lab: 137 mg/dL — ABNORMAL HIGH (ref 70–100)
Lab: 137 mg/dL — ABNORMAL HIGH (ref 70–100)
Lab: 146 mg/dL — ABNORMAL HIGH (ref 70–100)
Lab: 151 mg/dL — ABNORMAL HIGH (ref 70–100)
Lab: 152 mg/dL — ABNORMAL HIGH (ref 70–100)
Lab: 154 mg/dL — ABNORMAL HIGH (ref 70–100)
Lab: 170 mg/dL — ABNORMAL HIGH (ref 70–100)
Lab: 54 mg/dL — ABNORMAL LOW (ref 70–100)

## 2017-12-29 LAB — GRAM STAIN

## 2017-12-29 LAB — PHOSPHORUS: Lab: 4.6 mg/dL — ABNORMAL HIGH (ref >60)

## 2017-12-29 LAB — BASIC METABOLIC PANEL: Lab: 141 MMOL/L — ABNORMAL HIGH (ref >60)

## 2017-12-29 LAB — MAGNESIUM: Lab: 1.9 mg/dL — ABNORMAL LOW (ref 1.6–2.6)

## 2017-12-29 MED ORDER — DEXTROSE 50 % IN WATER (D50W) IV SYRG
25 mL | Freq: Once | INTRAVENOUS | 0 refills | Status: CP
Start: 2017-12-29 — End: ?
  Administered 2017-12-29: 11:00:00 25 mL via INTRAVENOUS

## 2017-12-29 MED ORDER — INSULIN ASPART 100 UNIT/ML SC FLEXPEN
0-12 [IU] | Freq: Every day | SUBCUTANEOUS | 0 refills | Status: DC
Start: 2017-12-29 — End: 2017-12-30

## 2017-12-29 MED ORDER — INSULIN ASPART 100 UNIT/ML SC FLEXPEN
0-24 [IU] | Freq: Before meals | SUBCUTANEOUS | 0 refills | Status: DC
Start: 2017-12-29 — End: 2018-01-03

## 2017-12-30 ENCOUNTER — Inpatient Hospital Stay: Admit: 2017-12-30 | Discharge: 2017-12-30 | Payer: MEDICARE

## 2017-12-30 LAB — BASIC METABOLIC PANEL
Lab: 135 MMOL/L — ABNORMAL LOW (ref 137–147)
Lab: 137 MMOL/L (ref 137–147)

## 2017-12-30 LAB — CBC
Lab: 2.5 M/UL — ABNORMAL LOW (ref 4.4–5.5)
Lab: 7.8 g/dL — ABNORMAL LOW (ref 13.5–16.5)
Lab: 9.7 K/UL (ref 4.5–11.0)
Lab: 8.1 10*3/uL — ABNORMAL HIGH (ref 4.5–11.0)
Lab: 104 10*3/uL — ABNORMAL LOW (ref 150–400)
Lab: 2.6 M/UL — ABNORMAL LOW (ref 4.4–5.5)
Lab: 23 % — ABNORMAL LOW (ref 40–50)
Lab: 29 pg (ref 26–34)
Lab: 33 g/dL (ref 32.0–36.0)
Lab: 7.7 10*3/uL (ref 4.5–11.0)
Lab: 8 g/dL — ABNORMAL LOW (ref 13.5–16.5)
Lab: 8.9 FL (ref 7–11)
Lab: 87 FL (ref 80–100)

## 2017-12-30 LAB — PHOSPHORUS: Lab: 4.2 mg/dL — ABNORMAL HIGH (ref >60)

## 2017-12-30 LAB — CULTURE-WOUND/TISSUE/FLUID(AEROBIC ONLY)W/SENSITIVITY

## 2017-12-30 LAB — MAGNESIUM: Lab: 2.1 mg/dL — ABNORMAL LOW (ref >60)

## 2017-12-30 LAB — POC GLUCOSE
Lab: 173 mg/dL — ABNORMAL HIGH (ref 70–100)
Lab: 184 mg/dL — ABNORMAL HIGH (ref 70–100)
Lab: 140 mg/dL — ABNORMAL HIGH (ref 70–100)

## 2017-12-30 MED ORDER — HYDROMORPHONE (PF) 2 MG/ML IJ SYRG
.5-1 mg | INTRAVENOUS | 0 refills | Status: DC | PRN
Start: 2017-12-30 — End: 2018-01-03
  Administered 2017-12-30: 19:00:00 0.5 mg via INTRAVENOUS
  Administered 2017-12-31: 03:00:00 1 mg via INTRAVENOUS
  Administered 2018-01-03 (×3): 0.5 mg via INTRAVENOUS

## 2017-12-30 MED ORDER — ALBUMIN, HUMAN 5 % IV SOLP
500 mL | Freq: Once | INTRAVENOUS | 0 refills | Status: CP
Start: 2017-12-30 — End: ?
  Administered 2017-12-30: 14:00:00 500 mL via INTRAVENOUS

## 2017-12-30 MED ORDER — HYDRALAZINE 20 MG/ML IJ SOLN
10 mg | INTRAVENOUS | 0 refills | Status: DC | PRN
Start: 2017-12-30 — End: 2018-01-14
  Administered 2017-12-30 – 2018-01-04 (×5): 10 mg via INTRAVENOUS

## 2017-12-31 ENCOUNTER — Inpatient Hospital Stay: Admit: 2017-12-31 | Discharge: 2017-12-31 | Payer: MEDICARE

## 2017-12-31 ENCOUNTER — Encounter: Admit: 2017-12-31 | Discharge: 2017-12-31 | Payer: MEDICARE

## 2017-12-31 DIAGNOSIS — R0600 Dyspnea, unspecified: ICD-10-CM

## 2017-12-31 DIAGNOSIS — L02612 Cutaneous abscess of left foot: Secondary | ICD-10-CM

## 2017-12-31 DIAGNOSIS — E119 Type 2 diabetes mellitus without complications: ICD-10-CM

## 2017-12-31 DIAGNOSIS — K859 Acute pancreatitis without necrosis or infection, unspecified: ICD-10-CM

## 2017-12-31 DIAGNOSIS — I10 Essential (primary) hypertension: Principal | ICD-10-CM

## 2017-12-31 DIAGNOSIS — E785 Hyperlipidemia, unspecified: ICD-10-CM

## 2017-12-31 DIAGNOSIS — F17200 Nicotine dependence, unspecified, uncomplicated: ICD-10-CM

## 2017-12-31 LAB — CBC
Lab: 7.9 K/UL — ABNORMAL LOW (ref 4.5–11.0)
Lab: 7.5 K/UL — ABNORMAL LOW (ref 4.5–11.0)
Lab: 124 10*3/uL — ABNORMAL LOW (ref 150–400)
Lab: 2.8 M/UL — ABNORMAL LOW (ref 4.4–5.5)
Lab: 25 % — ABNORMAL LOW (ref 40–50)
Lab: 29 pg (ref 26–34)
Lab: 34 g/dL (ref 32.0–36.0)
Lab: 7.5 10*3/uL (ref 4.5–11.0)
Lab: 8.6 g/dL — ABNORMAL LOW (ref 13.5–16.5)
Lab: 8.9 FL (ref 7–11)
Lab: 87 FL (ref 80–100)
Lab: 2.9 M/UL — ABNORMAL LOW (ref 4.4–5.5)
Lab: 26 % — ABNORMAL LOW (ref 40–50)
Lab: 8 K/UL — ABNORMAL HIGH (ref >60)
Lab: 8.8 g/dL — ABNORMAL LOW (ref 13.5–16.5)
Lab: 87 FL — ABNORMAL LOW (ref 80–100)

## 2017-12-31 LAB — POC GLUCOSE
Lab: 135 mg/dL — ABNORMAL HIGH (ref 70–100)
Lab: 130 mg/dL — ABNORMAL HIGH (ref 70–100)
Lab: 129 mg/dL — ABNORMAL HIGH (ref 70–100)
Lab: 164 mg/dL — ABNORMAL HIGH (ref 70–100)

## 2017-12-31 LAB — BASIC METABOLIC PANEL: Lab: 139 MMOL/L — ABNORMAL HIGH (ref 137–147)

## 2017-12-31 LAB — PHOSPHORUS: Lab: 3.9 mg/dL — ABNORMAL LOW (ref >60)

## 2017-12-31 LAB — MAGNESIUM: Lab: 2.1 mg/dL — ABNORMAL HIGH (ref >60)

## 2017-12-31 MED ORDER — SODIUM CHLORIDE 0.9 % IV SOLP
INTRAVENOUS | 0 refills | Status: CN
Start: 2017-12-31 — End: ?

## 2017-12-31 MED ORDER — PEG-ELECTROLYTE SOLN 420 GRAM PO SOLR
4 L | Freq: Once | ORAL | 0 refills | Status: AC
Start: 2017-12-31 — End: ?

## 2018-01-01 ENCOUNTER — Encounter: Admit: 2018-01-01 | Discharge: 2018-01-01 | Payer: MEDICARE

## 2018-01-01 DIAGNOSIS — E119 Type 2 diabetes mellitus without complications: ICD-10-CM

## 2018-01-01 DIAGNOSIS — E785 Hyperlipidemia, unspecified: ICD-10-CM

## 2018-01-01 DIAGNOSIS — R0600 Dyspnea, unspecified: ICD-10-CM

## 2018-01-01 DIAGNOSIS — K859 Acute pancreatitis without necrosis or infection, unspecified: ICD-10-CM

## 2018-01-01 DIAGNOSIS — I10 Essential (primary) hypertension: Principal | ICD-10-CM

## 2018-01-01 DIAGNOSIS — F17200 Nicotine dependence, unspecified, uncomplicated: ICD-10-CM

## 2018-01-01 LAB — POC GLUCOSE
Lab: 133 mg/dL — ABNORMAL HIGH (ref 70–100)
Lab: 116 mg/dL — ABNORMAL HIGH (ref 70–100)
Lab: 119 mg/dL — ABNORMAL HIGH (ref 70–100)
Lab: 95 mg/dL (ref 70–100)

## 2018-01-01 LAB — BASIC METABOLIC PANEL: Lab: 138 MMOL/L — ABNORMAL LOW (ref >60)

## 2018-01-01 LAB — PHOSPHORUS: Lab: 3.6 mg/dL (ref 2.0–4.5)

## 2018-01-01 LAB — CBC
Lab: 7.8 10*3/uL (ref 4.5–11.0)
Lab: 7.1 10*3/uL — ABNORMAL HIGH (ref 4.5–11.0)
Lab: 8.4 10*3/uL (ref 4.5–11.0)
Lab: 7.9 K/UL — ABNORMAL LOW (ref 4.5–11.0)

## 2018-01-01 LAB — MAGNESIUM: Lab: 2.1 mg/dL — ABNORMAL LOW (ref 1.6–2.6)

## 2018-01-01 MED ORDER — HEPARIN 5,000 UNITS IN LR 500 ML IRR
0 refills | Status: DC
Start: 2018-01-01 — End: 2018-01-01
  Administered 2018-01-01 (×2): 500 mL

## 2018-01-01 MED ORDER — FENTANYL CITRATE (PF) 50 MCG/ML IJ SOLN
0 refills | Status: DC
Start: 2018-01-01 — End: 2018-01-02
  Administered 2018-01-01 (×2): 25 ug via INTRAVENOUS

## 2018-01-01 MED ORDER — SODIUM CITRATE-CITRIC ACID 500-334 MG/5 ML PO SOLN
0 refills | Status: DC
Start: 2018-01-01 — End: 2018-01-02
  Administered 2018-01-01: 17:00:00 30 mL via ORAL

## 2018-01-01 MED ORDER — ACETAMINOPHEN 325 MG PO TAB
650 mg | Freq: Once | ORAL | 0 refills | Status: CP
Start: 2018-01-01 — End: ?
  Administered 2018-01-01: 19:00:00 650 mg via ORAL

## 2018-01-01 MED ORDER — CEFAZOLIN 1 GRAM IJ SOLR
0 refills | Status: DC
Start: 2018-01-01 — End: 2018-01-01
  Administered 2018-01-01: 18:00:00 1000 mg via INTRAVENOUS

## 2018-01-01 MED ORDER — FENTANYL CITRATE (PF) 50 MCG/ML IJ SOLN
25 ug | INTRAVENOUS | 0 refills | Status: DC | PRN
Start: 2018-01-01 — End: 2018-01-01
  Administered 2018-01-01 (×3): 25 ug via INTRAVENOUS

## 2018-01-01 MED ORDER — HALOPERIDOL LACTATE 5 MG/ML IJ SOLN
0 refills | Status: DC
Start: 2018-01-01 — End: 2018-01-02
  Administered 2018-01-01: 18:00:00 1 mg via INTRAVENOUS

## 2018-01-01 MED ORDER — ONDANSETRON HCL (PF) 4 MG/2 ML IJ SOLN
0 refills | Status: DC
Start: 2018-01-01 — End: 2018-01-02
  Administered 2018-01-01: 18:00:00 4 mg via INTRAVENOUS

## 2018-01-01 MED ORDER — SODIUM CHLORIDE 0.9 % IV SOLP
1000 mL | INTRAVENOUS | 0 refills | Status: AC
Start: 2018-01-01 — End: ?
  Administered 2018-01-01: 16:00:00 1000 mL via INTRAVENOUS

## 2018-01-01 MED ORDER — SODIUM CHLORIDE 0.9 % IV SOLP
INTRAVENOUS | 0 refills | Status: CN
Start: 2018-01-01 — End: ?

## 2018-01-01 MED ORDER — PROPOFOL INJ 10 MG/ML IV VIAL
0 refills | Status: DC
Start: 2018-01-01 — End: 2018-01-02
  Administered 2018-01-01: 18:00:00 30 mg via INTRAVENOUS
  Administered 2018-01-01: 18:00:00 20 mg via INTRAVENOUS
  Administered 2018-01-01: 18:00:00 50 mg via INTRAVENOUS
  Administered 2018-01-01: 18:00:00 20 mg via INTRAVENOUS

## 2018-01-01 MED ORDER — AMLODIPINE 10 MG PO TAB
10 mg | Freq: Every day | ORAL | 0 refills | Status: DC
Start: 2018-01-01 — End: 2018-01-14
  Administered 2018-01-01 – 2018-01-14 (×13): 10 mg via ORAL

## 2018-01-01 MED ORDER — OXYCODONE 5 MG PO TAB
10 mg | Freq: Once | ORAL | 0 refills | Status: CP
Start: 2018-01-01 — End: ?
  Administered 2018-01-01: 19:00:00 10 mg via ORAL

## 2018-01-02 ENCOUNTER — Encounter: Admit: 2018-01-02 | Discharge: 2018-01-02 | Payer: MEDICARE

## 2018-01-02 DIAGNOSIS — L02612 Cutaneous abscess of left foot: Secondary | ICD-10-CM

## 2018-01-02 DIAGNOSIS — E119 Type 2 diabetes mellitus without complications: ICD-10-CM

## 2018-01-02 DIAGNOSIS — E785 Hyperlipidemia, unspecified: ICD-10-CM

## 2018-01-02 DIAGNOSIS — K859 Acute pancreatitis without necrosis or infection, unspecified: ICD-10-CM

## 2018-01-02 DIAGNOSIS — R0600 Dyspnea, unspecified: ICD-10-CM

## 2018-01-02 DIAGNOSIS — F17200 Nicotine dependence, unspecified, uncomplicated: ICD-10-CM

## 2018-01-02 DIAGNOSIS — I10 Essential (primary) hypertension: Principal | ICD-10-CM

## 2018-01-02 LAB — POC GLUCOSE
Lab: 97 mg/dL (ref 70–100)
Lab: 86 mg/dL (ref 70–100)
Lab: 87 mg/dL (ref 70–100)
Lab: 112 mg/dL — ABNORMAL HIGH (ref 70–100)

## 2018-01-02 LAB — CBC
Lab: 2.7 M/UL — ABNORMAL LOW (ref 4.4–5.5)
Lab: 24 % — ABNORMAL LOW (ref >60)
Lab: 30 pg (ref 26–34)
Lab: 7 10*3/uL — ABNORMAL LOW (ref 4.5–11.0)
Lab: 8.3 g/dL — ABNORMAL LOW (ref 13.5–16.5)
Lab: 89 FL (ref >60)
Lab: 6.2 K/UL — ABNORMAL HIGH (ref >60)

## 2018-01-02 LAB — CULTURE-ANAEROBIC

## 2018-01-02 LAB — PHOSPHORUS: Lab: 3.7 mg/dL — ABNORMAL HIGH (ref 2.0–4.5)

## 2018-01-02 LAB — BASIC METABOLIC PANEL: Lab: 138 MMOL/L — ABNORMAL LOW (ref >60)

## 2018-01-02 LAB — MAGNESIUM: Lab: 2 mg/dL — ABNORMAL HIGH (ref 1.6–2.6)

## 2018-01-02 MED ORDER — DEXTRAN 70-HYPROMELLOSE (PF) 0.1-0.3 % OP DPET
0 refills | Status: DC
Start: 2018-01-02 — End: 2018-01-02
  Administered 2018-01-02: 18:00:00 2 [drp] via OPHTHALMIC

## 2018-01-02 MED ORDER — FENTANYL CITRATE (PF) 50 MCG/ML IJ SOLN
0 refills | Status: DC
Start: 2018-01-02 — End: 2018-01-02
  Administered 2018-01-02: 18:00:00 100 ug via INTRAVENOUS

## 2018-01-02 MED ORDER — LIDOCAINE (PF) 200 MG/10 ML (2 %) IJ SYRG
0 refills | Status: DC
Start: 2018-01-02 — End: 2018-01-02
  Administered 2018-01-02: 18:00:00 80 mg via INTRAVENOUS

## 2018-01-02 MED ORDER — SODIUM CHLORIDE 0.9 % IV SOLP
1000 mL | INTRAVENOUS | 0 refills | Status: DC
Start: 2018-01-02 — End: 2018-01-03

## 2018-01-02 MED ORDER — LACTATED RINGERS IV SOLP
1000 mL | INTRAVENOUS | 0 refills | Status: DC
Start: 2018-01-02 — End: 2018-01-03
  Administered 2018-01-02: 17:00:00 1000.000 mL via INTRAVENOUS

## 2018-01-02 MED ORDER — PROPOFOL INJ 10 MG/ML IV VIAL
0 refills | Status: DC
Start: 2018-01-02 — End: 2018-01-02
  Administered 2018-01-02: 18:00:00 160 mg via INTRAVENOUS

## 2018-01-02 MED ORDER — PHENYLEPHRINE IN 0.9% NACL(PF) 1 MG/10 ML (100 MCG/ML) IV SYRG
INTRAVENOUS | 0 refills | Status: DC
Start: 2018-01-02 — End: 2018-01-02
  Administered 2018-01-02 (×2): 100 ug via INTRAVENOUS

## 2018-01-02 MED ORDER — DEXAMETHASONE SODIUM PHOSPHATE 4 MG/ML IJ SOLN
INTRAVENOUS | 0 refills | Status: DC
Start: 2018-01-02 — End: 2018-01-02
  Administered 2018-01-02: 18:00:00 4 mg via INTRAVENOUS

## 2018-01-02 MED ORDER — SUCCINYLCHOLINE CHLORIDE 20 MG/ML IJ SOLN
INTRAVENOUS | 0 refills | Status: DC
Start: 2018-01-02 — End: 2018-01-02
  Administered 2018-01-02: 18:00:00 100 mg via INTRAVENOUS

## 2018-01-02 MED ORDER — ONDANSETRON HCL (PF) 4 MG/2 ML IJ SOLN
INTRAVENOUS | 0 refills | Status: DC
Start: 2018-01-02 — End: 2018-01-02
  Administered 2018-01-02: 18:00:00 4 mg via INTRAVENOUS

## 2018-01-03 ENCOUNTER — Encounter: Admit: 2018-01-03 | Discharge: 2018-01-03 | Payer: MEDICARE

## 2018-01-03 DIAGNOSIS — E785 Hyperlipidemia, unspecified: ICD-10-CM

## 2018-01-03 DIAGNOSIS — R0600 Dyspnea, unspecified: ICD-10-CM

## 2018-01-03 DIAGNOSIS — I10 Essential (primary) hypertension: Principal | ICD-10-CM

## 2018-01-03 DIAGNOSIS — K859 Acute pancreatitis without necrosis or infection, unspecified: ICD-10-CM

## 2018-01-03 DIAGNOSIS — F17200 Nicotine dependence, unspecified, uncomplicated: ICD-10-CM

## 2018-01-03 DIAGNOSIS — E119 Type 2 diabetes mellitus without complications: ICD-10-CM

## 2018-01-03 LAB — MAGNESIUM: Lab: 2.1 mg/dL (ref 1.6–2.6)

## 2018-01-03 LAB — PHOSPHORUS: Lab: 4.6 mg/dL — ABNORMAL HIGH (ref 2.0–4.5)

## 2018-01-03 LAB — BASIC METABOLIC PANEL
Lab: 135 MMOL/L — ABNORMAL LOW (ref 137–147)
Lab: 14 MMOL/L — ABNORMAL LOW (ref 21–30)
Lab: 15 % — ABNORMAL HIGH (ref 3–12)
Lab: 2.4 mg/dL — ABNORMAL HIGH (ref 0.4–1.24)
Lab: 204 mg/dL — ABNORMAL HIGH (ref 70–100)
Lab: 26 mL/min — ABNORMAL LOW (ref >60)
Lab: 32 mL/min — ABNORMAL LOW (ref >60)
Lab: 49 mg/dL — ABNORMAL HIGH (ref 7–25)
Lab: 8.2 mg/dL — ABNORMAL LOW (ref >60.0)

## 2018-01-03 LAB — POC GLUCOSE
Lab: 254 mg/dL — ABNORMAL HIGH (ref 70–100)
Lab: 201 mg/dL — ABNORMAL HIGH (ref 70–100)
Lab: 200 mg/dL — ABNORMAL HIGH (ref 70–100)
Lab: 226 mg/dL — ABNORMAL HIGH (ref 70–100)
Lab: 278 mg/dL — ABNORMAL HIGH (ref >60)

## 2018-01-03 LAB — CBC
Lab: 2.9 M/UL — ABNORMAL LOW (ref 4.4–5.5)
Lab: 26 % — ABNORMAL LOW (ref >60)
Lab: 7.5 K/UL — ABNORMAL LOW (ref 4.5–11.0)
Lab: 8.9 g/dL — ABNORMAL LOW (ref >60)

## 2018-01-03 LAB — CULTURE-WOUND/TISSUE/FLUID(AEROBIC ONLY)W/SENSITIVITY

## 2018-01-03 MED ORDER — INSULIN GLARGINE 100 UNIT/ML (3 ML) SC INJ PEN
6 [IU] | Freq: Every evening | SUBCUTANEOUS | 0 refills | Status: DC
Start: 2018-01-03 — End: 2018-01-03

## 2018-01-03 MED ORDER — LEVOFLOXACIN 750 MG PO TAB
750 mg | ORAL | 0 refills | Status: DC
Start: 2018-01-03 — End: 2018-01-10
  Administered 2018-01-03 – 2018-01-09 (×4): 750 mg via ORAL

## 2018-01-03 MED ORDER — POLYETHYLENE GLYCOL 3350 17 GRAM PO PWPK
1 | Freq: Every day | ORAL | 0 refills | Status: DC
Start: 2018-01-03 — End: 2018-01-04

## 2018-01-03 MED ORDER — ASPIRIN 81 MG PO CHEW
81 mg | Freq: Every day | ORAL | 0 refills | Status: DC
Start: 2018-01-03 — End: 2018-01-14
  Administered 2018-01-03 – 2018-01-14 (×11): 81 mg via ORAL

## 2018-01-03 MED ORDER — AMOXICILLIN-POT CLAVULANATE 875-125 MG PO TAB
875 mg | Freq: Two times a day (BID) | ORAL | 0 refills | Status: DC
Start: 2018-01-03 — End: 2018-01-10
  Administered 2018-01-03 – 2018-01-10 (×12): 875 mg via ORAL

## 2018-01-03 MED ORDER — OXYCODONE 5 MG PO TAB
5-10 mg | ORAL | 0 refills | Status: DC | PRN
Start: 2018-01-03 — End: 2018-01-14
  Administered 2018-01-03 – 2018-01-08 (×24): 10 mg via ORAL
  Administered 2018-01-11: 16:00:00 5 mg via ORAL
  Administered 2018-01-12 (×2): 10 mg via ORAL
  Administered 2018-01-12: 5 mg via ORAL
  Administered 2018-01-12 – 2018-01-13 (×3): 10 mg via ORAL
  Administered 2018-01-13 (×2): 5 mg via ORAL
  Administered 2018-01-14 (×2): 10 mg via ORAL
  Administered 2018-01-14: 03:00:00 5 mg via ORAL
  Administered 2018-01-14: 18:00:00 10 mg via ORAL
  Administered 2018-01-14: 03:00:00 5 mg via ORAL

## 2018-01-03 MED ORDER — INSULIN ASPART 100 UNIT/ML SC FLEXPEN
4 [IU] | Freq: Three times a day (TID) | SUBCUTANEOUS | 0 refills | Status: DC
Start: 2018-01-03 — End: 2018-01-04

## 2018-01-03 MED ORDER — INSULIN ASPART 100 UNIT/ML SC FLEXPEN
0-6 [IU] | Freq: Before meals | SUBCUTANEOUS | 0 refills | Status: DC
Start: 2018-01-03 — End: 2018-01-14
  Administered 2018-01-04: 13:00:00 2 [IU] via SUBCUTANEOUS

## 2018-01-03 MED ORDER — INSULIN GLARGINE 100 UNIT/ML (3 ML) SC INJ PEN
13 [IU] | Freq: Every evening | SUBCUTANEOUS | 0 refills | Status: DC
Start: 2018-01-03 — End: 2018-01-04
  Administered 2018-01-04: 03:00:00 13 [IU] via SUBCUTANEOUS

## 2018-01-04 LAB — CBC
Lab: 6.7 K/UL — ABNORMAL LOW (ref 4.5–11.0)
Lab: 16 % — ABNORMAL HIGH (ref 11–15)
Lab: 21 % — ABNORMAL LOW (ref 40–50)
Lab: 30 pg (ref 26–34)
Lab: 34 g/dL (ref 32.0–36.0)
Lab: 7.5 g/dL — ABNORMAL LOW (ref 13.5–16.5)
Lab: 7.7 10*3/uL (ref 4.5–11.0)
Lab: 7.9 FL (ref 7–11)
Lab: 88 FL (ref 80–100)

## 2018-01-04 LAB — MAGNESIUM: Lab: 2 mg/dL — ABNORMAL LOW (ref 1.6–2.6)

## 2018-01-04 LAB — POC GLUCOSE
Lab: 253 mg/dL — ABNORMAL HIGH (ref 70–100)
Lab: 228 mg/dL — ABNORMAL HIGH (ref 70–100)
Lab: 233 mg/dL — ABNORMAL HIGH (ref 70–100)
Lab: 218 mg/dL — ABNORMAL HIGH (ref 70–100)

## 2018-01-04 LAB — PHOSPHORUS: Lab: 4.4 mg/dL — ABNORMAL HIGH (ref 2.0–4.5)

## 2018-01-04 LAB — BASIC METABOLIC PANEL: Lab: 136 MMOL/L — ABNORMAL LOW (ref 137–147)

## 2018-01-04 MED ORDER — INSULIN ASPART 100 UNIT/ML SC FLEXPEN
5 [IU] | Freq: Three times a day (TID) | SUBCUTANEOUS | 0 refills | Status: DC
Start: 2018-01-04 — End: 2018-01-14

## 2018-01-04 MED ORDER — POLYETHYLENE GLYCOL 3350 17 GRAM PO PWPK
1 | Freq: Two times a day (BID) | ORAL | 0 refills | Status: DC
Start: 2018-01-04 — End: 2018-01-14
  Administered 2018-01-06 – 2018-01-10 (×10): 17 g via ORAL

## 2018-01-04 MED ORDER — GABAPENTIN 250 MG/5 ML PO SOLN
100 mg | Freq: Three times a day (TID) | ORAL | 0 refills | Status: DC
Start: 2018-01-04 — End: 2018-01-04

## 2018-01-04 MED ORDER — GABAPENTIN 100 MG PO CAP
200 mg | Freq: Two times a day (BID) | ORAL | 0 refills | Status: DC
Start: 2018-01-04 — End: 2018-01-11
  Administered 2018-01-05 – 2018-01-11 (×11): 200 mg via ORAL

## 2018-01-04 MED ORDER — SENNOSIDES 8.8 MG/5 ML PO SYRP
8.8 mg | Freq: Two times a day (BID) | ORAL | 0 refills | Status: DC
Start: 2018-01-04 — End: 2018-01-07
  Administered 2018-01-04 – 2018-01-07 (×6): 8.8 mg via ORAL

## 2018-01-04 MED ORDER — INSULIN GLARGINE 100 UNIT/ML (3 ML) SC INJ PEN
15 [IU] | Freq: Every evening | SUBCUTANEOUS | 0 refills | Status: DC
Start: 2018-01-04 — End: 2018-01-10

## 2018-01-04 MED ORDER — GABAPENTIN 100 MG PO CAP
200 mg | Freq: Three times a day (TID) | ORAL | 0 refills | Status: DC
Start: 2018-01-04 — End: 2018-01-04

## 2018-01-05 ENCOUNTER — Inpatient Hospital Stay: Admit: 2018-01-05 | Discharge: 2018-01-05 | Payer: MEDICARE

## 2018-01-05 DIAGNOSIS — L02612 Cutaneous abscess of left foot: Secondary | ICD-10-CM

## 2018-01-05 LAB — CBC
Lab: 5.3 K/UL — ABNORMAL LOW (ref >60)
Lab: 120 10*3/uL — ABNORMAL LOW (ref 150–400)
Lab: 16 % — ABNORMAL HIGH (ref 11–15)
Lab: 2.5 M/UL — ABNORMAL LOW (ref 4.4–5.5)
Lab: 23 % — ABNORMAL LOW (ref 40–50)
Lab: 29 pg (ref 26–34)
Lab: 33 g/dL (ref 32.0–36.0)
Lab: 6.8 10*3/uL (ref 4.5–11.0)
Lab: 7.7 g/dL — ABNORMAL LOW (ref 13.5–16.5)
Lab: 8.4 FL (ref 7–11)
Lab: 88 FL (ref 80–100)

## 2018-01-05 LAB — POC GLUCOSE
Lab: 210 mg/dL — ABNORMAL HIGH (ref 70–100)
Lab: 144 mg/dL — ABNORMAL HIGH (ref 70–100)
Lab: 131 mg/dL — ABNORMAL HIGH (ref 70–100)
Lab: 122 mg/dL — ABNORMAL HIGH (ref 70–100)

## 2018-01-05 LAB — PHOSPHORUS: Lab: 3.8 mg/dL — ABNORMAL LOW (ref 2.0–4.5)

## 2018-01-05 LAB — BASIC METABOLIC PANEL: Lab: 138 MMOL/L — ABNORMAL LOW (ref 137–147)

## 2018-01-05 LAB — MAGNESIUM: Lab: 2 mg/dL — ABNORMAL HIGH (ref >60)

## 2018-01-05 MED ORDER — MELATONIN 3 MG PO TAB
3 mg | Freq: Every evening | ORAL | 0 refills | Status: DC
Start: 2018-01-05 — End: 2018-01-14
  Administered 2018-01-06 – 2018-01-14 (×9): 3 mg via ORAL

## 2018-01-06 LAB — POC GLUCOSE
Lab: 181 mg/dL — ABNORMAL HIGH (ref 70–100)
Lab: 173 mg/dL — ABNORMAL HIGH (ref 70–100)
Lab: 165 mg/dL — ABNORMAL HIGH (ref 70–100)
Lab: 243 mg/dL — ABNORMAL HIGH (ref 70–100)

## 2018-01-06 LAB — BASIC METABOLIC PANEL: Lab: 137 MMOL/L — ABNORMAL LOW (ref >60)

## 2018-01-06 LAB — MAGNESIUM: Lab: 1.9 mg/dL — ABNORMAL HIGH (ref 1.6–2.6)

## 2018-01-06 LAB — PHOSPHORUS: Lab: 3 mg/dL — ABNORMAL HIGH (ref 2.0–4.5)

## 2018-01-06 LAB — CBC: Lab: 4.1 K/UL — ABNORMAL LOW (ref >60)

## 2018-01-07 ENCOUNTER — Encounter: Admit: 2018-01-07 | Discharge: 2018-01-07 | Payer: MEDICARE

## 2018-01-07 LAB — CBC
Lab: 4.4 K/UL — ABNORMAL LOW (ref 4.5–11.0)
Lab: 1.6 M/UL — ABNORMAL LOW (ref 4.4–5.5)
Lab: 15 % — ABNORMAL LOW (ref 40–50)
Lab: 16 % — ABNORMAL HIGH (ref 11–15)
Lab: 31 pg (ref 26–34)
Lab: 35 g/dL (ref 32.0–36.0)
Lab: 4 10*3/uL — ABNORMAL LOW (ref 4.5–11.0)
Lab: 5.3 g/dL — CL (ref 13.5–16.5)
Lab: 81 10*3/uL — ABNORMAL LOW (ref 150–400)
Lab: 9.2 FL (ref 7–11)
Lab: 90 FL (ref 80–100)

## 2018-01-07 LAB — POC GLUCOSE
Lab: 206 mg/dL — ABNORMAL HIGH (ref 70–100)
Lab: 208 mg/dL — ABNORMAL HIGH (ref 70–100)
Lab: 126 mg/dL — ABNORMAL HIGH (ref 70–100)
Lab: 171 mg/dL — ABNORMAL HIGH (ref 70–100)

## 2018-01-07 LAB — BASIC METABOLIC PANEL: Lab: 136 MMOL/L — ABNORMAL LOW (ref 137–147)

## 2018-01-07 LAB — PREALBUMIN: Lab: 18 mg/dL (ref 17–34)

## 2018-01-07 LAB — HEMOGLOBIN: Lab: 6.6 g/dL — ABNORMAL LOW (ref 13.5–16.5)

## 2018-01-07 LAB — MAGNESIUM: Lab: 1.7 mg/dL (ref 1.6–2.6)

## 2018-01-07 LAB — PHOSPHORUS: Lab: 2.6 mg/dL — ABNORMAL HIGH (ref 2.0–4.5)

## 2018-01-07 MED ORDER — SENNOSIDES 8.6 MG PO TAB
1 | Freq: Two times a day (BID) | ORAL | 0 refills | Status: DC
Start: 2018-01-07 — End: 2018-01-09
  Administered 2018-01-07 – 2018-01-09 (×4): 1 via ORAL

## 2018-01-08 DIAGNOSIS — L02612 Cutaneous abscess of left foot: Secondary | ICD-10-CM

## 2018-01-08 LAB — POC GLUCOSE
Lab: 215 mg/dL — ABNORMAL HIGH (ref 70–100)
Lab: 170 mg/dL — ABNORMAL HIGH (ref 70–100)
Lab: 187 mg/dL — ABNORMAL HIGH (ref 70–100)
Lab: 176 mg/dL — ABNORMAL HIGH (ref 70–100)

## 2018-01-08 LAB — LDH-LACTATE DEHYDROGENASE: Lab: 154 U/L (ref 100–210)

## 2018-01-08 LAB — BASIC METABOLIC PANEL
Lab: 138 MMOL/L (ref >40)
Lab: 193 mg/dL — ABNORMAL HIGH (ref 70–100)
Lab: 24 MMOL/L — ABNORMAL LOW (ref 21–30)
Lab: 52 mg/dL — ABNORMAL HIGH (ref 7–25)

## 2018-01-08 LAB — CBC: Lab: 4.9 K/UL — ABNORMAL LOW (ref <100)

## 2018-01-08 LAB — RETICULOCYTE COUNT
Lab: 1.5 % (ref >60)
Lab: 3.1 % — ABNORMAL HIGH (ref >60)

## 2018-01-08 LAB — HEMOGLOBIN A1C: Lab: 5.4 % (ref 4.0–6.0)

## 2018-01-08 LAB — HAPTOGLOBIN: Lab: 145 mg/dL (ref 16–200)

## 2018-01-08 LAB — BILIRUBIN,TOTAL: Lab: 0.3 mg/dL (ref 0.3–1.2)

## 2018-01-08 MED ORDER — MAGNESIUM HYDROXIDE 2,400 MG/10 ML PO SUSP
10 mL | Freq: Once | ORAL | 0 refills | Status: CP
Start: 2018-01-08 — End: ?
  Administered 2018-01-08: 14:00:00 10 mL via ORAL

## 2018-01-08 MED ORDER — BISACODYL 10 MG RE SUPP
10 mg | Freq: Every day | RECTAL | 0 refills | Status: DC | PRN
Start: 2018-01-08 — End: 2018-01-14

## 2018-01-09 ENCOUNTER — Encounter: Admit: 2018-01-09 | Discharge: 2018-01-09 | Payer: MEDICARE

## 2018-01-09 LAB — POC BLOOD GAS VEN
Lab: 11 MMOL/L
Lab: 15 MMOL/L
Lab: 34 mmHg — ABNORMAL LOW (ref 36–50)
Lab: 7.2 — ABNORMAL LOW (ref 7.30–7.40)
Lab: 74 % — ABNORMAL HIGH (ref 55–71)

## 2018-01-09 LAB — CBC
Lab: 3.8 K/UL — ABNORMAL LOW (ref 4.5–11.0)
Lab: 2.5 M/UL — ABNORMAL LOW (ref 4.4–5.5)
Lab: 22 % — ABNORMAL LOW (ref 40–50)
Lab: 4.6 10*3/uL (ref 4.5–11.0)
Lab: 7.7 g/dL — ABNORMAL LOW (ref 13.5–16.5)
Lab: 90 FL (ref 80–100)
Lab: 10 FL (ref 7–11)
Lab: 17 % — ABNORMAL HIGH (ref 11–15)
Lab: 2.4 M/UL — ABNORMAL LOW (ref 4.4–5.5)
Lab: 22 % — ABNORMAL LOW (ref 40–50)
Lab: 28 pg (ref 26–34)
Lab: 31 g/dL — ABNORMAL LOW (ref 32.0–36.0)
Lab: 5 10*3/uL (ref 4.5–11.0)
Lab: 7.2 g/dL — ABNORMAL LOW (ref 13.5–16.5)
Lab: 88 10*3/uL — ABNORMAL LOW (ref 150–400)
Lab: 90 FL (ref 80–100)

## 2018-01-09 LAB — POC HEMATOCRIT
Lab: 28 % — ABNORMAL LOW (ref 40–50)
Lab: 9.5 g/dL — ABNORMAL LOW (ref 13.5–16.5)

## 2018-01-09 LAB — POC GLUCOSE
Lab: 155 mg/dL — ABNORMAL HIGH (ref >60)
Lab: 89 mg/dL (ref 70–100)
Lab: 92 mg/dL (ref 70–100)
Lab: 83 mg/dL (ref 70–100)
Lab: 172 mg/dL — ABNORMAL HIGH (ref 70–100)
Lab: 300 mg/dL — ABNORMAL HIGH (ref 70–100)
Lab: 74 mg/dL (ref 70–100)
Lab: 143 mg/dL — ABNORMAL HIGH (ref 70–100)
Lab: 179 mg/dL — ABNORMAL HIGH (ref 70–100)

## 2018-01-09 LAB — BASIC METABOLIC PANEL
Lab: 137 MMOL/L (ref 137–147)
Lab: 2.1 mg/dL — ABNORMAL HIGH (ref 0.4–1.24)
Lab: 24 MMOL/L (ref 21–30)
Lab: 31 mL/min — ABNORMAL LOW (ref >60)
Lab: 37 mL/min — ABNORMAL LOW (ref >60)
Lab: 48 mg/dL — ABNORMAL HIGH (ref 7–25)
Lab: 8.4 mg/dL — ABNORMAL LOW (ref 8.5–10.6)
Lab: 99 mg/dL (ref 70–100)
Lab: 3 (ref 3–12)

## 2018-01-09 LAB — PERIPHERAL SMEAR

## 2018-01-09 LAB — PHOSPHORUS: Lab: 3.2 mg/dL (ref 2.0–4.5)

## 2018-01-09 LAB — POC SODIUM: Lab: 143 MMOL/L (ref 137–147)

## 2018-01-09 LAB — POC IONIZED CALCIUM: Lab: 1 MMOL/L (ref 1.0–1.3)

## 2018-01-09 LAB — MAGNESIUM: Lab: 1.9 mg/dL (ref 1.6–2.6)

## 2018-01-09 LAB — POC POTASSIUM: Lab: 3.4 MMOL/L — ABNORMAL LOW (ref 3.5–5.1)

## 2018-01-09 MED ORDER — FENTANYL CITRATE (PF) 50 MCG/ML IJ SOLN
50 ug | INTRAVENOUS | 0 refills | Status: DC | PRN
Start: 2018-01-09 — End: 2018-01-09

## 2018-01-09 MED ORDER — MIDAZOLAM 1 MG/ML IJ SOLN
INTRAVENOUS | 0 refills | Status: DC
Start: 2018-01-09 — End: 2018-01-09
  Administered 2018-01-09: 16:00:00 2 mg via INTRAVENOUS

## 2018-01-09 MED ORDER — SENNOSIDES 8.6 MG PO TAB
1 | Freq: Two times a day (BID) | ORAL | 0 refills | Status: DC
Start: 2018-01-09 — End: 2018-01-14
  Administered 2018-01-10 – 2018-01-14 (×5): 1 via ORAL

## 2018-01-09 MED ORDER — PROMETHAZINE 25 MG/ML IJ SOLN
6.25 mg | INTRAVENOUS | 0 refills | Status: DC | PRN
Start: 2018-01-09 — End: 2018-01-09

## 2018-01-09 MED ORDER — DIPHENHYDRAMINE HCL 50 MG/ML IJ SOLN
25 mg | Freq: Once | INTRAVENOUS | 0 refills | Status: DC | PRN
Start: 2018-01-09 — End: 2018-01-09

## 2018-01-09 MED ORDER — DEXTROSE 50 % IN WATER (D50W) IV SOLP
0 refills | Status: DC
Start: 2018-01-09 — End: 2018-01-09
  Administered 2018-01-09: 18:00:00 50 mL via INTRAVENOUS

## 2018-01-09 MED ORDER — LIDOCAINE (PF) 200 MG/10 ML (2 %) IJ SYRG
0 refills | Status: DC
Start: 2018-01-09 — End: 2018-01-09
  Administered 2018-01-09: 16:00:00 100 mg via INTRAVENOUS

## 2018-01-09 MED ORDER — GLYCOPYRROLATE 0.2 MG/ML IJ SOLN
0 refills | Status: DC
Start: 2018-01-09 — End: 2018-01-09
  Administered 2018-01-09: 17:00:00 1 mg via INTRAVENOUS

## 2018-01-09 MED ORDER — CEFAZOLIN 1GM NS IRR
0 refills | Status: DC
Start: 2018-01-09 — End: 2018-01-09
  Administered 2018-01-09 (×2): 1000 mL

## 2018-01-09 MED ORDER — BUPIVACAINE 0.5 % (5 MG/ML) IJ SOLN
0 refills | Status: DC
Start: 2018-01-09 — End: 2018-01-09
  Administered 2018-01-09: 19:00:00 25 mL
  Administered 2018-01-09: 20:00:00 15 mL

## 2018-01-09 MED ORDER — ROCURONIUM 10 MG/ML IV SOLN
INTRAVENOUS | 0 refills | Status: DC
Start: 2018-01-09 — End: 2018-01-09
  Administered 2018-01-09: 16:00:00 50 mg via INTRAVENOUS

## 2018-01-09 MED ORDER — HYDROMORPHONE (PF) 2 MG/ML IJ SYRG
.3 mg | Freq: Once | INTRAVENOUS | 0 refills | Status: CP
Start: 2018-01-09 — End: ?
  Administered 2018-01-09: 15:00:00 0.3 mg via INTRAVENOUS

## 2018-01-09 MED ORDER — HALOPERIDOL LACTATE 5 MG/ML IJ SOLN
0 refills | Status: DC
Start: 2018-01-09 — End: 2018-01-09
  Administered 2018-01-09: 16:00:00 1 mg via INTRAVENOUS

## 2018-01-09 MED ORDER — PROPOFOL INJ 10 MG/ML IV VIAL
0 refills | Status: DC
Start: 2018-01-09 — End: 2018-01-09
  Administered 2018-01-09: 16:00:00 120 mg via INTRAVENOUS
  Administered 2018-01-09: 17:00:00 80 mg via INTRAVENOUS

## 2018-01-09 MED ORDER — FENTANYL CITRATE (PF) 50 MCG/ML IJ SOLN
25 ug | INTRAVENOUS | 0 refills | Status: DC | PRN
Start: 2018-01-09 — End: 2018-01-14
  Administered 2018-01-12 – 2018-01-13 (×3): 25 ug via INTRAVENOUS

## 2018-01-09 MED ORDER — ACETAMINOPHEN 325 MG PO TAB
650 mg | ORAL | 0 refills | Status: DC
Start: 2018-01-09 — End: 2018-01-14
  Administered 2018-01-09 – 2018-01-14 (×19): 650 mg via ORAL

## 2018-01-09 MED ORDER — FENTANYL CITRATE (PF) 50 MCG/ML IJ SOLN
25 ug | INTRAVENOUS | 0 refills | Status: DC | PRN
Start: 2018-01-09 — End: 2018-01-09
  Administered 2018-01-09: 19:00:00 25 ug via INTRAVENOUS

## 2018-01-09 MED ORDER — METOCLOPRAMIDE HCL 5 MG/ML IJ SOLN
10 mg | Freq: Once | INTRAVENOUS | 0 refills | Status: DC | PRN
Start: 2018-01-09 — End: 2018-01-09

## 2018-01-09 MED ORDER — ONDANSETRON HCL (PF) 4 MG/2 ML IJ SOLN
INTRAVENOUS | 0 refills | Status: DC
Start: 2018-01-09 — End: 2018-01-09
  Administered 2018-01-09: 18:00:00 4 mg via INTRAVENOUS

## 2018-01-09 MED ORDER — FENTANYL CITRATE (PF) 50 MCG/ML IJ SOLN
0 refills | Status: DC
Start: 2018-01-09 — End: 2018-01-09
  Administered 2018-01-09: 16:00:00 100 ug via INTRAVENOUS

## 2018-01-09 MED ORDER — FENTANYL CITRATE (PF) 50 MCG/ML IJ SOLN
25 ug | INTRAVENOUS | 0 refills | Status: DC | PRN
Start: 2018-01-09 — End: 2018-01-09

## 2018-01-09 MED ORDER — DEXTRAN 70-HYPROMELLOSE (PF) 0.1-0.3 % OP DPET
0 refills | Status: DC
Start: 2018-01-09 — End: 2018-01-09
  Administered 2018-01-09: 16:00:00 1 [drp] via OPHTHALMIC

## 2018-01-09 MED ORDER — SODIUM CHLORIDE 0.9 % IV SOLP
1000 mL | INTRAVENOUS | 0 refills | Status: DC
Start: 2018-01-09 — End: 2018-01-09
  Administered 2018-01-09: 15:00:00 1000 mL via INTRAVENOUS

## 2018-01-09 MED ORDER — PHENYLEPHRINE IN 0.9% NACL(PF) 1 MG/10 ML (100 MCG/ML) IV SYRG
INTRAVENOUS | 0 refills | Status: DC
Start: 2018-01-09 — End: 2018-01-09
  Administered 2018-01-09: 17:00:00 200 ug via INTRAVENOUS

## 2018-01-09 MED ORDER — BUPIVACAINE 0.125% PCA PNC SYR
PERINEURAL | 0 refills | Status: DC
Start: 2018-01-09 — End: 2018-01-10
  Administered 2018-01-09 – 2018-01-10 (×3): 50.000 mL via PERINEURAL

## 2018-01-09 MED ORDER — CEFAZOLIN 1 GRAM IJ SOLR
0 refills | Status: DC
Start: 2018-01-09 — End: 2018-01-09
  Administered 2018-01-09: 16:00:00 2 g via INTRAVENOUS

## 2018-01-09 MED ORDER — PHENYLEPHRINE IV DRIP (STD CONC)
0 refills | Status: DC
Start: 2018-01-09 — End: 2018-01-09
  Administered 2018-01-09 (×2): 0.2 ug/kg/min via INTRAVENOUS

## 2018-01-09 MED ORDER — HALOPERIDOL LACTATE 5 MG/ML IJ SOLN
1 mg | Freq: Once | INTRAVENOUS | 0 refills | Status: DC | PRN
Start: 2018-01-09 — End: 2018-01-09

## 2018-01-09 MED ORDER — KETAMINE 10 MG/ML IJ SOLN
0 refills | Status: DC
Start: 2018-01-09 — End: 2018-01-09
  Administered 2018-01-09: 16:00:00 30 mg via INTRAVENOUS

## 2018-01-09 MED ORDER — DEXAMETHASONE SODIUM PHOSPHATE 4 MG/ML IJ SOLN
0 refills | Status: DC
Start: 2018-01-09 — End: 2018-01-09
  Administered 2018-01-09: 17:00:00 4 mg via INTRAVENOUS

## 2018-01-09 MED ORDER — BUPIVACAINE 0.125% PCA PNC SYR
PERINEURAL | 0 refills | Status: DC
Start: 2018-01-09 — End: 2018-01-10
  Administered 2018-01-09 – 2018-01-10 (×3): 50.000 mL via PERINEURAL

## 2018-01-09 MED ORDER — NEOSTIGMINE METHYLSULFATE 1 MG/ML IJ SOLN
0 refills | Status: DC
Start: 2018-01-09 — End: 2018-01-09
  Administered 2018-01-09: 17:00:00 5 mg via INTRAVENOUS

## 2018-01-09 MED ORDER — MAGNESIUM CITRATE PO SOLN
296 mL | Freq: Once | ORAL | 0 refills | Status: CP
Start: 2018-01-09 — End: ?
  Administered 2018-01-09: 296 mL via ORAL

## 2018-01-10 LAB — BASIC METABOLIC PANEL
Lab: 108 MMOL/L (ref 98–110)
Lab: 136 MMOL/L — ABNORMAL LOW (ref >60)
Lab: 2 mg/dL — ABNORMAL HIGH (ref 0.4–1.24)
Lab: 204 mg/dL — ABNORMAL HIGH (ref 70–100)
Lab: 24 MMOL/L (ref 21–30)
Lab: 4.7 MMOL/L (ref 3.5–5.1)
Lab: 45 mg/dL — ABNORMAL HIGH (ref 7–25)
Lab: 8.3 mg/dL — ABNORMAL LOW (ref 8.5–10.6)
Lab: 4 (ref 3–12)

## 2018-01-10 LAB — POC GLUCOSE
Lab: 317 mg/dL — ABNORMAL HIGH (ref 70–100)
Lab: 233 mg/dL — ABNORMAL HIGH (ref 70–100)
Lab: 182 mg/dL — ABNORMAL HIGH (ref 70–100)
Lab: 121 mg/dL — ABNORMAL HIGH (ref 70–100)

## 2018-01-10 LAB — CBC: Lab: 4.4 K/UL — ABNORMAL LOW (ref 4.5–11.0)

## 2018-01-10 LAB — ERYTHROPOIETIN: Lab: 14 mU/mL (ref 3.7–29.5)

## 2018-01-10 MED ORDER — BUPIVACAINE 0.125% PCA PNC SYR
PERINEURAL | 0 refills | Status: DC
Start: 2018-01-10 — End: 2018-01-11
  Administered 2018-01-10: 17:00:00 50.000 mL via PERINEURAL

## 2018-01-10 MED ORDER — BISACODYL 10 MG RE SUPP
20 mg | Freq: Once | RECTAL | 0 refills | Status: CP
Start: 2018-01-10 — End: ?
  Administered 2018-01-10: 16:00:00 20 mg via RECTAL

## 2018-01-10 MED ORDER — BUPIVACAINE 0.125% PCA PNC SYR
PERINEURAL | 0 refills | Status: DC
Start: 2018-01-10 — End: 2018-01-12
  Administered 2018-01-10 – 2018-01-11 (×4): 50.000 mL via PERINEURAL

## 2018-01-10 MED ORDER — INSULIN GLARGINE 100 UNIT/ML (3 ML) SC INJ PEN
17 [IU] | Freq: Every evening | SUBCUTANEOUS | 0 refills | Status: DC
Start: 2018-01-10 — End: 2018-01-14

## 2018-01-10 MED ORDER — CITALOPRAM 40 MG PO TAB
40 mg | Freq: Every evening | ORAL | 0 refills | Status: DC
Start: 2018-01-10 — End: 2018-01-14
  Administered 2018-01-11 – 2018-01-14 (×4): 40 mg via ORAL

## 2018-01-10 MED ORDER — MAGNESIUM CITRATE PO SOLN
296 mL | Freq: Once | ORAL | 0 refills | Status: CP
Start: 2018-01-10 — End: ?
  Administered 2018-01-10: 13:00:00 296 mL via ORAL

## 2018-01-11 ENCOUNTER — Encounter: Admit: 2018-01-11 | Discharge: 2018-01-11 | Payer: MEDICARE

## 2018-01-11 DIAGNOSIS — E785 Hyperlipidemia, unspecified: ICD-10-CM

## 2018-01-11 DIAGNOSIS — I10 Essential (primary) hypertension: Principal | ICD-10-CM

## 2018-01-11 DIAGNOSIS — E119 Type 2 diabetes mellitus without complications: ICD-10-CM

## 2018-01-11 DIAGNOSIS — I2510 Atherosclerotic heart disease of native coronary artery without angina pectoris: ICD-10-CM

## 2018-01-11 DIAGNOSIS — R0600 Dyspnea, unspecified: ICD-10-CM

## 2018-01-11 DIAGNOSIS — F17200 Nicotine dependence, unspecified, uncomplicated: ICD-10-CM

## 2018-01-11 DIAGNOSIS — I739 Peripheral vascular disease, unspecified: ICD-10-CM

## 2018-01-11 DIAGNOSIS — K859 Acute pancreatitis without necrosis or infection, unspecified: ICD-10-CM

## 2018-01-11 DIAGNOSIS — N189 Chronic kidney disease, unspecified: ICD-10-CM

## 2018-01-11 LAB — POC GLUCOSE
Lab: 201 mg/dL — ABNORMAL HIGH (ref 70–100)
Lab: 191 mg/dL — ABNORMAL HIGH (ref 70–100)
Lab: 195 mg/dL — ABNORMAL HIGH (ref 70–100)
Lab: 151 mg/dL — ABNORMAL HIGH (ref 70–100)
Lab: 139 mg/dL — ABNORMAL HIGH (ref 70–100)

## 2018-01-11 LAB — CBC: Lab: 4.7 K/UL — ABNORMAL LOW (ref >60)

## 2018-01-11 LAB — BASIC METABOLIC PANEL: Lab: 135 MMOL/L — ABNORMAL LOW (ref 137–147)

## 2018-01-11 MED ORDER — FUROSEMIDE 40 MG PO TAB
40 mg | Freq: Every day | ORAL | 0 refills | Status: DC
Start: 2018-01-11 — End: 2018-01-14
  Administered 2018-01-12 – 2018-01-14 (×3): 40 mg via ORAL

## 2018-01-11 MED ORDER — GABAPENTIN 100 MG PO CAP
200 mg | Freq: Three times a day (TID) | ORAL | 0 refills | Status: DC
Start: 2018-01-11 — End: 2018-01-14
  Administered 2018-01-11 – 2018-01-14 (×11): 200 mg via ORAL

## 2018-01-11 MED ORDER — FUROSEMIDE 40 MG PO TAB
40 mg | Freq: Every day | ORAL | 0 refills | Status: CN
Start: 2018-01-11 — End: ?

## 2018-01-11 MED ORDER — GABAPENTIN 100 MG PO CAP
200 mg | Freq: Three times a day (TID) | ORAL | 0 refills | Status: DC
Start: 2018-01-11 — End: 2018-01-11

## 2018-01-12 LAB — BASIC METABOLIC PANEL: Lab: 136 MMOL/L — ABNORMAL LOW (ref >60)

## 2018-01-12 LAB — CBC: Lab: 3.9 10*3/uL — ABNORMAL LOW (ref >60)

## 2018-01-12 LAB — POC GLUCOSE
Lab: 159 mg/dL — ABNORMAL HIGH (ref 70–100)
Lab: 60 mg/dL — ABNORMAL LOW (ref 70–100)
Lab: 81 mg/dL (ref 70–100)
Lab: 135 mg/dL — ABNORMAL HIGH (ref 70–100)
Lab: 113 mg/dL — ABNORMAL HIGH (ref 70–100)

## 2018-01-13 LAB — BASIC METABOLIC PANEL: Lab: 135 MMOL/L — ABNORMAL LOW (ref >60)

## 2018-01-13 LAB — POC GLUCOSE
Lab: 193 mg/dL — ABNORMAL HIGH (ref 70–100)
Lab: 76 mg/dL (ref 70–100)
Lab: 112 mg/dL — ABNORMAL HIGH (ref 70–100)
Lab: 88 mg/dL (ref 70–100)

## 2018-01-13 LAB — CBC: Lab: 3.3 K/UL — ABNORMAL LOW (ref 4.5–11.0)

## 2018-01-14 ENCOUNTER — Inpatient Hospital Stay: Admit: 2018-01-07 | Discharge: 2018-01-07 | Payer: MEDICARE

## 2018-01-14 ENCOUNTER — Inpatient Hospital Stay
Admit: 2017-12-27 | Discharge: 2018-01-14 | Disposition: A | Discharge status: Rehabilitation Facility | Payer: MEDICARE | Source: Ambulatory Visit

## 2018-01-14 ENCOUNTER — Inpatient Hospital Stay: Admit: 2017-12-28 | Discharge: 2017-12-28 | Payer: MEDICARE

## 2018-01-14 ENCOUNTER — Inpatient Hospital Stay: Admit: 2018-01-02 | Discharge: 2018-01-02 | Payer: MEDICARE

## 2018-01-14 ENCOUNTER — Ambulatory Visit: Admit: 2017-12-27 | Discharge: 2017-12-27 | Payer: MEDICARE

## 2018-01-14 ENCOUNTER — Inpatient Hospital Stay: Admit: 2018-01-01 | Discharge: 2018-01-01 | Payer: MEDICARE

## 2018-01-14 ENCOUNTER — Encounter: Admit: 2018-01-14 | Discharge: 2018-01-14 | Payer: MEDICARE

## 2018-01-14 ENCOUNTER — Ambulatory Visit: Admit: 2018-01-09 | Discharge: 2018-01-09 | Payer: MEDICARE

## 2018-01-14 ENCOUNTER — Inpatient Hospital Stay: Admit: 2018-01-08 | Discharge: 2018-01-08 | Payer: MEDICARE

## 2018-01-14 ENCOUNTER — Inpatient Hospital Stay: Admit: 2018-01-09 | Discharge: 2018-01-09 | Payer: MEDICARE

## 2018-01-14 ENCOUNTER — Ambulatory Visit: Admit: 2018-01-09 | Discharge: 2018-01-10 | Payer: MEDICARE

## 2018-01-14 DIAGNOSIS — R269 Unspecified abnormalities of gait and mobility: ICD-10-CM

## 2018-01-14 DIAGNOSIS — K766 Portal hypertension: ICD-10-CM

## 2018-01-14 DIAGNOSIS — K760 Fatty (change of) liver, not elsewhere classified: ICD-10-CM

## 2018-01-14 DIAGNOSIS — N184 Chronic kidney disease, stage 4 (severe): ICD-10-CM

## 2018-01-14 DIAGNOSIS — E875 Hyperkalemia: ICD-10-CM

## 2018-01-14 DIAGNOSIS — R4189 Other symptoms and signs involving cognitive functions and awareness: ICD-10-CM

## 2018-01-14 DIAGNOSIS — K5900 Constipation, unspecified: ICD-10-CM

## 2018-01-14 DIAGNOSIS — R140 Abdominal distension (gaseous): ICD-10-CM

## 2018-01-14 DIAGNOSIS — F419 Anxiety disorder, unspecified: ICD-10-CM

## 2018-01-14 DIAGNOSIS — I5032 Chronic diastolic (congestive) heart failure: ICD-10-CM

## 2018-01-14 DIAGNOSIS — E1152 Type 2 diabetes mellitus with diabetic peripheral angiopathy with gangrene: Principal | ICD-10-CM

## 2018-01-14 DIAGNOSIS — L97529 Non-pressure chronic ulcer of other part of left foot with unspecified severity: ICD-10-CM

## 2018-01-14 DIAGNOSIS — Z794 Long term (current) use of insulin: ICD-10-CM

## 2018-01-14 DIAGNOSIS — I2510 Atherosclerotic heart disease of native coronary artery without angina pectoris: ICD-10-CM

## 2018-01-14 DIAGNOSIS — Z9049 Acquired absence of other specified parts of digestive tract: ICD-10-CM

## 2018-01-14 DIAGNOSIS — K861 Other chronic pancreatitis: ICD-10-CM

## 2018-01-14 DIAGNOSIS — I864 Gastric varices: ICD-10-CM

## 2018-01-14 DIAGNOSIS — Z87891 Personal history of nicotine dependence: ICD-10-CM

## 2018-01-14 DIAGNOSIS — I130 Hypertensive heart and chronic kidney disease with heart failure and stage 1 through stage 4 chronic kidney disease, or unspecified chronic kidney disease: ICD-10-CM

## 2018-01-14 DIAGNOSIS — E782 Mixed hyperlipidemia: ICD-10-CM

## 2018-01-14 DIAGNOSIS — Z9861 Coronary angioplasty status: ICD-10-CM

## 2018-01-14 DIAGNOSIS — Z7982 Long term (current) use of aspirin: ICD-10-CM

## 2018-01-14 DIAGNOSIS — K3189 Other diseases of stomach and duodenum: ICD-10-CM

## 2018-01-14 DIAGNOSIS — K210 Gastro-esophageal reflux disease with esophagitis: ICD-10-CM

## 2018-01-14 DIAGNOSIS — E1122 Type 2 diabetes mellitus with diabetic chronic kidney disease: ICD-10-CM

## 2018-01-14 DIAGNOSIS — L02612 Cutaneous abscess of left foot: Secondary | ICD-10-CM

## 2018-01-14 DIAGNOSIS — K644 Residual hemorrhoidal skin tags: ICD-10-CM

## 2018-01-14 DIAGNOSIS — D62 Acute posthemorrhagic anemia: ICD-10-CM

## 2018-01-14 LAB — POC GLUCOSE
Lab: 132 mg/dL — ABNORMAL HIGH (ref 70–100)
Lab: 99 mg/dL (ref 70–100)
Lab: 80 mg/dL (ref 70–100)

## 2018-01-14 LAB — CBC: Lab: 3.8 K/UL — ABNORMAL LOW (ref >60)

## 2018-01-14 LAB — IONIZED CALCIUM: Lab: 1.1 MMOL/L — ABNORMAL HIGH (ref 9.0–11.5)

## 2018-01-14 LAB — BASIC METABOLIC PANEL: Lab: 135 MMOL/L — ABNORMAL LOW (ref >60)

## 2018-01-14 MED ORDER — FUROSEMIDE 40 MG PO TAB
40 mg | Freq: Two times a day (BID) | ORAL | 0 refills | Status: DC
Start: 2018-01-14 — End: 2018-01-14

## 2018-01-14 MED ORDER — GABAPENTIN 100 MG PO CAP
200 mg | ORAL_CAPSULE | Freq: Three times a day (TID) | ORAL | 0 refills | Status: AC
Start: 2018-01-14 — End: 2018-01-30

## 2018-01-14 MED ORDER — INSULIN GLARGINE 100 UNIT/ML (3 ML) SC INJ PEN
15 [IU] | Freq: Every evening | SUBCUTANEOUS | 0 refills | Status: DC
Start: 2018-01-14 — End: 2018-01-14

## 2018-01-14 MED ORDER — POLYETHYLENE GLYCOL 3350 17 GRAM PO PWPK
17 g | Freq: Two times a day (BID) | ORAL | 0 refills | 22.00000 days | Status: AC | PRN
Start: 2018-01-14 — End: ?

## 2018-01-14 MED ORDER — DOCUSATE SODIUM 100 MG PO CAP
100 mg | ORAL_CAPSULE | Freq: Two times a day (BID) | ORAL | 3 refills | Status: AC | PRN
Start: 2018-01-14 — End: 2018-04-01

## 2018-01-14 MED ORDER — OXYCODONE 5 MG PO TAB
5 mg | ORAL_TABLET | ORAL | 0 refills | 6.00000 days | Status: AC | PRN
Start: 2018-01-14 — End: 2018-01-14

## 2018-01-14 MED ORDER — LACTULOSE 10 GRAM/15 ML PO SOLN
20 g | Freq: Three times a day (TID) | ORAL | 0 refills | 21.00000 days | Status: AC
Start: 2018-01-14 — End: 2018-01-30

## 2018-01-14 MED ORDER — OXYCODONE 5 MG PO TAB
5 mg | ORAL_TABLET | ORAL | 0 refills | Status: SS | PRN
Start: 2018-01-14 — End: 2018-01-30

## 2018-01-15 ENCOUNTER — Encounter: Admit: 2018-01-15 | Discharge: 2018-01-15 | Payer: MEDICARE

## 2018-01-20 ENCOUNTER — Inpatient Hospital Stay: Admit: 2018-01-22 | Discharge: 2018-01-22 | Payer: MEDICARE

## 2018-01-20 ENCOUNTER — Encounter: Admit: 2018-01-20 | Discharge: 2018-01-20 | Payer: MEDICARE

## 2018-01-20 DIAGNOSIS — K922 Gastrointestinal hemorrhage, unspecified: Secondary | ICD-10-CM

## 2018-01-21 ENCOUNTER — Inpatient Hospital Stay
Admit: 2018-01-21 | Discharge: 2018-01-30 | Disposition: A | Discharge status: Skilled Nursing Facility | Payer: MEDICARE | Source: Other Acute Inpatient Hospital | Attending: Student in an Organized Health Care Education/Training Program

## 2018-01-21 ENCOUNTER — Encounter: Admit: 2018-01-21 | Discharge: 2018-01-21 | Payer: MEDICARE

## 2018-01-21 ENCOUNTER — Inpatient Hospital Stay: Admit: 2018-01-21 | Discharge: 2018-01-21 | Payer: MEDICARE

## 2018-01-21 DIAGNOSIS — K922 Gastrointestinal hemorrhage, unspecified: ICD-10-CM

## 2018-01-21 LAB — CBC
Lab: 5.8 10*3/uL (ref 4.5–11.0)
Lab: 10 FL (ref 7–11)
Lab: 103 10*3/uL — ABNORMAL LOW (ref 150–400)
Lab: 15 % — ABNORMAL HIGH (ref >60)
Lab: 2.2 M/UL — ABNORMAL LOW (ref >60)
Lab: 20 % — ABNORMAL LOW (ref 40–50)
Lab: 30 pg — ABNORMAL LOW (ref 26–34)
Lab: 4.1 K/UL — ABNORMAL LOW (ref 4.5–11.0)
Lab: 6.9 g/dL — ABNORMAL LOW (ref >60)
Lab: 10 FL (ref 7–11)
Lab: 108 10*3/uL — ABNORMAL LOW (ref 150–400)
Lab: 15 % — ABNORMAL HIGH (ref 11–15)
Lab: 2.6 M/UL — ABNORMAL LOW (ref 4.4–5.5)
Lab: 23 % — ABNORMAL LOW (ref 40–50)
Lab: 29 pg (ref 26–34)
Lab: 34 g/dL (ref 32.0–36.0)
Lab: 4.8 10*3/uL (ref 4.5–11.0)
Lab: 8 g/dL — ABNORMAL LOW (ref 13.5–16.5)
Lab: 87 FL (ref 80–100)

## 2018-01-21 LAB — COMPREHENSIVE METABOLIC PANEL
Lab: 0.4 mg/dL (ref 0.3–1.2)
Lab: 10 U/L (ref 7–40)
Lab: 104 mg/dL — ABNORMAL HIGH (ref 7–25)
Lab: 137 MMOL/L — ABNORMAL LOW (ref 137–147)
Lab: 181 mg/dL — ABNORMAL HIGH (ref 70–100)
Lab: 2.3 g/dL — ABNORMAL LOW (ref 3.5–5.0)
Lab: 24 MMOL/L (ref 21–30)
Lab: 26 mL/min — ABNORMAL LOW (ref >60)
Lab: 31 mL/min — ABNORMAL LOW (ref >60)
Lab: 5 U/L — ABNORMAL LOW (ref 7–56)
Lab: 7 (ref 3–12)
Lab: 7 mg/dL — ABNORMAL LOW (ref 8.5–10.6)
Lab: 72 U/L (ref 25–110)

## 2018-01-21 LAB — POC GLUCOSE
Lab: 136 mg/dL — ABNORMAL HIGH (ref 70–100)
Lab: 194 mg/dL — ABNORMAL HIGH (ref 70–100)
Lab: 61 mg/dL — ABNORMAL LOW (ref 70–100)
Lab: 71 mg/dL (ref 70–100)
Lab: 73 mg/dL (ref 70–100)
Lab: 77 mg/dL — ABNORMAL LOW (ref >60)
Lab: 78 mg/dL (ref 70–100)
Lab: 95 mg/dL (ref 70–100)
Lab: 99 mg/dL (ref 70–100)

## 2018-01-21 LAB — PROTIME INR (PT): Lab: 1.1 g/dL — ABNORMAL LOW (ref 0.8–1.2)

## 2018-01-21 LAB — LACTIC ACID(LACTATE): Lab: 0.6 MMOL/L — ABNORMAL LOW (ref 0.5–2.0)

## 2018-01-21 LAB — MAGNESIUM: Lab: 2.4 mg/dL (ref 1.6–2.6)

## 2018-01-21 LAB — PHOSPHORUS: Lab: 4.5 mg/dL (ref 2.0–4.5)

## 2018-01-21 MED ORDER — OXYCODONE 10 MG PO TAB
10 mg | ORAL | 0 refills | Status: DC | PRN
Start: 2018-01-21 — End: 2018-01-31
  Administered 2018-01-21 – 2018-01-30 (×25): 10 mg via ORAL

## 2018-01-21 MED ORDER — CEFTRIAXONE INJ 1GM IVP
1 g | INTRAVENOUS | 0 refills | Status: DC
Start: 2018-01-21 — End: 2018-01-24
  Administered 2018-01-23 – 2018-01-24 (×2): 1 g via INTRAVENOUS

## 2018-01-21 MED ORDER — FENTANYL CITRATE (PF) 50 MCG/ML IJ SOLN
50 ug | INTRAVENOUS | 0 refills | Status: DC | PRN
Start: 2018-01-21 — End: 2018-01-22

## 2018-01-21 MED ORDER — PROPOFOL INJ 10 MG/ML IV VIAL
0 refills | Status: DC
Start: 2018-01-21 — End: 2018-01-22
  Administered 2018-01-22: 01:00:00 100 mg via INTRAVENOUS

## 2018-01-21 MED ORDER — DEXTROSE 50 % IN WATER (D50W) IV SOLP
25 mL | Freq: Once | INTRAVENOUS | 0 refills | Status: CP
Start: 2018-01-21 — End: ?

## 2018-01-21 MED ORDER — PHENYLEPHRINE IN 0.9% NACL(PF) 1 MG/10 ML (100 MCG/ML) IV SYRG
INTRAVENOUS | 0 refills | Status: DC
Start: 2018-01-21 — End: 2018-01-22
  Administered 2018-01-22: 01:00:00 100 ug via INTRAVENOUS

## 2018-01-21 MED ORDER — PANTOPRAZOLE 40 MG IV SOLR
40 mg | Freq: Two times a day (BID) | INTRAVENOUS | 0 refills | Status: DC
Start: 2018-01-21 — End: 2018-01-31
  Administered 2018-01-21 – 2018-01-30 (×20): 40 mg via INTRAVENOUS

## 2018-01-21 MED ORDER — CARVEDILOL 12.5 MG PO TAB
12.5 mg | Freq: Two times a day (BID) | ORAL | 0 refills | Status: DC
Start: 2018-01-21 — End: 2018-01-22
  Administered 2018-01-21 – 2018-01-22 (×3): 12.5 mg via ORAL

## 2018-01-21 MED ORDER — GLYCOPYRROLATE 0.2 MG/ML IJ SOLN
0 refills | Status: DC
Start: 2018-01-21 — End: 2018-01-22
  Administered 2018-01-22: 01:00:00 .2 mg via INTRAVENOUS

## 2018-01-21 MED ORDER — MUPIROCIN 2 % TP OINT
Freq: Every day | TOPICAL | 0 refills | Status: DC
Start: 2018-01-21 — End: 2018-01-31
  Administered 2018-01-21 – 2018-01-29 (×3): via TOPICAL

## 2018-01-21 MED ORDER — SODIUM CHLORIDE 0.9 % IV SOLP
INTRAVENOUS | 0 refills | Status: CN
Start: 2018-01-21 — End: ?

## 2018-01-21 MED ORDER — PROMETHAZINE 25 MG/ML IJ SOLN
6.25 mg | INTRAVENOUS | 0 refills | Status: DC | PRN
Start: 2018-01-21 — End: 2018-01-22

## 2018-01-21 MED ORDER — OXYCODONE 5 MG PO TAB
5 mg | ORAL | 0 refills | Status: DC | PRN
Start: 2018-01-21 — End: 2018-01-21
  Administered 2018-01-21: 08:00:00 5 mg via ORAL

## 2018-01-21 MED ORDER — GABAPENTIN 100 MG PO CAP
200 mg | Freq: Three times a day (TID) | ORAL | 0 refills | Status: DC
Start: 2018-01-21 — End: 2018-01-21
  Administered 2018-01-21: 07:00:00 200 mg via ORAL

## 2018-01-21 MED ORDER — ATORVASTATIN 40 MG PO TAB
40 mg | Freq: Every evening | ORAL | 0 refills | Status: DC
Start: 2018-01-21 — End: 2018-01-31
  Administered 2018-01-21 – 2018-01-30 (×9): 40 mg via ORAL

## 2018-01-21 MED ORDER — INSULIN ASPART 100 UNIT/ML SC FLEXPEN
0-6 [IU] | Freq: Before meals | SUBCUTANEOUS | 0 refills | Status: DC
Start: 2018-01-21 — End: 2018-01-31
  Administered 2018-01-22: 17:00:00 1 [IU] via SUBCUTANEOUS

## 2018-01-21 MED ORDER — INSULIN GLARGINE 100 UNIT/ML (3 ML) SC INJ PEN
6 [IU] | Freq: Every evening | SUBCUTANEOUS | 0 refills | Status: DC
Start: 2018-01-21 — End: 2018-01-22

## 2018-01-21 MED ORDER — SUCCINYLCHOLINE CHLORIDE 20 MG/ML IJ SOLN
INTRAVENOUS | 0 refills | Status: DC
Start: 2018-01-21 — End: 2018-01-22
  Administered 2018-01-22: 01:00:00 100 mg via INTRAVENOUS

## 2018-01-21 MED ORDER — NITROGLYCERIN 0.4 MG SL SUBL
.4 mg | SUBLINGUAL | 0 refills | Status: DC | PRN
Start: 2018-01-21 — End: 2018-01-31

## 2018-01-21 MED ORDER — GABAPENTIN 300 MG PO CAP
300 mg | Freq: Three times a day (TID) | ORAL | 0 refills | Status: DC
Start: 2018-01-21 — End: 2018-01-31
  Administered 2018-01-21 – 2018-01-30 (×27): 300 mg via ORAL

## 2018-01-21 MED ORDER — FENTANYL CITRATE (PF) 50 MCG/ML IJ SOLN
0 refills | Status: DC
Start: 2018-01-21 — End: 2018-01-22
  Administered 2018-01-22: 01:00:00 100 ug via INTRAVENOUS

## 2018-01-21 MED ORDER — ONDANSETRON HCL (PF) 4 MG/2 ML IJ SOLN
4 mg | Freq: Once | INTRAVENOUS | 0 refills | Status: DC | PRN
Start: 2018-01-21 — End: 2018-01-22

## 2018-01-21 MED ORDER — OXYCODONE 10 MG PO TAB
10 mg | ORAL | 0 refills | Status: DC | PRN
Start: 2018-01-21 — End: 2018-01-21
  Administered 2018-01-21: 09:00:00 10 mg via ORAL

## 2018-01-21 MED ORDER — INSULIN GLARGINE 100 UNIT/ML (3 ML) SC INJ PEN
13 [IU] | Freq: Every evening | SUBCUTANEOUS | 0 refills | Status: DC
Start: 2018-01-21 — End: 2018-01-21
  Administered 2018-01-21: 08:00:00 13 [IU] via SUBCUTANEOUS

## 2018-01-21 MED ORDER — OCTREOTIDE (SANDOSTATIN) BOLUS FOR CONTINUOUS INFUSION
50 ug | Freq: Once | INTRAVENOUS | 0 refills | Status: CP
Start: 2018-01-21 — End: ?

## 2018-01-21 MED ORDER — LACTATED RINGERS IV SOLP
1000 mL | Freq: Once | INTRAVENOUS | 0 refills | Status: DC
Start: 2018-01-21 — End: 2018-01-22

## 2018-01-21 MED ORDER — LACTATED RINGERS IV SOLP
500 mL | INTRAVENOUS | 0 refills | Status: DC
Start: 2018-01-21 — End: 2018-01-21

## 2018-01-21 MED ORDER — OCTREOTIDE IV DRIP
50 ug/h | INTRAVENOUS | 0 refills | Status: AC
Start: 2018-01-21 — End: ?
  Administered 2018-01-22 – 2018-01-24 (×13): 50 ug/h via INTRAVENOUS

## 2018-01-21 MED ORDER — PANTOPRAZOLE 40 MG PO TBEC
40 mg | Freq: Two times a day (BID) | ORAL | 0 refills | Status: DC
Start: 2018-01-21 — End: 2018-01-21

## 2018-01-21 MED ORDER — OCTREOTIDE IV DRIP
50 ug/h | INTRAVENOUS | 0 refills | Status: DC
Start: 2018-01-21 — End: 2018-01-21
  Administered 2018-01-21 (×2): 50 ug/h via INTRAVENOUS

## 2018-01-21 MED ORDER — CITALOPRAM 20 MG PO TAB
40 mg | Freq: Every evening | ORAL | 0 refills | Status: DC
Start: 2018-01-21 — End: 2018-01-31
  Administered 2018-01-21 – 2018-01-30 (×10): 40 mg via ORAL

## 2018-01-21 MED ORDER — CEFTRIAXONE INJ 1GM IVP
1 g | INTRAVENOUS | 0 refills | Status: DC
Start: 2018-01-21 — End: 2018-01-21
  Administered 2018-01-21: 07:00:00 1 g via INTRAVENOUS

## 2018-01-21 MED ORDER — RIFAXIMIN 550 MG PO TAB
550 mg | Freq: Two times a day (BID) | ORAL | 0 refills | Status: DC
Start: 2018-01-21 — End: 2018-01-21
  Administered 2018-01-21: 07:00:00 550 mg via ORAL

## 2018-01-21 MED ORDER — DEXAMETHASONE SODIUM PHOSPHATE 4 MG/ML IJ SOLN
INTRAVENOUS | 0 refills | Status: DC
Start: 2018-01-21 — End: 2018-01-22

## 2018-01-21 MED ORDER — CEFTRIAXONE 1 GRAM IJ SOLR
0 refills | Status: DC
Start: 2018-01-21 — End: 2018-01-22
  Administered 2018-01-22: 02:00:00 1 g via INTRAVENOUS

## 2018-01-21 MED ORDER — LIDOCAINE (PF) 200 MG/10 ML (2 %) IJ SYRG
0 refills | Status: DC
Start: 2018-01-21 — End: 2018-01-22
  Administered 2018-01-22: 01:00:00 100 mg via INTRAVENOUS

## 2018-01-21 MED ORDER — DEXTRAN 70-HYPROMELLOSE (PF) 0.1-0.3 % OP DPET
0 refills | Status: DC
Start: 2018-01-21 — End: 2018-01-22
  Administered 2018-01-22: 01:00:00 1 [drp] via OPHTHALMIC

## 2018-01-21 MED ORDER — SODIUM CHLORIDE 0.9 % IV SOLP
0 refills | Status: DC
Start: 2018-01-21 — End: 2018-01-22
  Administered 2018-01-22: 01:00:00 via INTRAVENOUS

## 2018-01-21 MED ADMIN — DEXTROSE 50 % IN WATER (D50W) IV SOLP [86270]: 25 mL | INTRAVENOUS | @ 21:00:00 | Stop: 2018-01-21 | NDC 00409664802

## 2018-01-21 MED ADMIN — DEXTROSE 50 % IN WATER (D50W) IV SOLP [86270]: 25 mL | INTRAVENOUS | @ 18:00:00 | Stop: 2018-01-21 | NDC 00409664802

## 2018-01-22 ENCOUNTER — Encounter: Admit: 2018-01-22 | Discharge: 2018-01-22 | Payer: MEDICARE

## 2018-01-22 ENCOUNTER — Inpatient Hospital Stay: Admit: 2018-01-22 | Discharge: 2018-01-22 | Payer: MEDICARE

## 2018-01-22 LAB — POC GLUCOSE
Lab: 63 mg/dL — ABNORMAL LOW (ref 70–100)
Lab: 98 mg/dL (ref 70–100)
Lab: 89 mg/dL (ref 70–100)
Lab: 150 mg/dL — ABNORMAL HIGH (ref 70–100)
Lab: 183 mg/dL — ABNORMAL HIGH (ref 70–100)
Lab: 188 mg/dL — ABNORMAL HIGH (ref 70–100)

## 2018-01-22 LAB — BLOOD GASES, PERIPHERAL VENOUS
Lab: 0.1 MMOL/L (ref 3–12)
Lab: 45 mmHg (ref 36–50)
Lab: 66 mmHg — ABNORMAL HIGH (ref 33–48)
Lab: 7.3 U/L (ref 7.30–7.40)
Lab: 92 % — ABNORMAL HIGH (ref >60)

## 2018-01-22 LAB — PROTIME INR (PT): Lab: 1.1 MMOL/L — ABNORMAL HIGH (ref 0.8–1.2)

## 2018-01-22 LAB — CBC
Lab: 2.9 M/UL — ABNORMAL LOW (ref >60)
Lab: 25 % — ABNORMAL LOW (ref >60)
Lab: 5.9 10*3/uL — ABNORMAL LOW (ref 4.5–11.0)
Lab: 8.6 g/dL — ABNORMAL LOW (ref >60)
Lab: 88 FL — ABNORMAL HIGH (ref >60)
Lab: 30 pg — ABNORMAL LOW (ref >60)
Lab: 5.7 K/UL — ABNORMAL HIGH (ref 4.5–11.0)
Lab: 8.3 g/dL — ABNORMAL LOW (ref 13.5–16.5)
Lab: 132 10*3/uL — ABNORMAL LOW (ref 150–400)
Lab: 15 % — ABNORMAL HIGH (ref 11–15)
Lab: 2.8 M/UL — ABNORMAL LOW (ref 4.4–5.5)
Lab: 24 % — ABNORMAL LOW (ref 40–50)
Lab: 29 pg (ref 26–34)
Lab: 34 g/dL (ref 32.0–36.0)
Lab: 5.9 10*3/uL (ref 4.5–11.0)
Lab: 8.4 g/dL — ABNORMAL LOW (ref 13.5–16.5)
Lab: 88 FL (ref 80–100)

## 2018-01-22 LAB — MAGNESIUM: Lab: 2.5 mg/dL — ABNORMAL LOW (ref 1.6–2.6)

## 2018-01-22 LAB — IONIZED CALCIUM,BG: Lab: 1.1 MMOL/L — ABNORMAL HIGH (ref >60)

## 2018-01-22 LAB — COMPREHENSIVE METABOLIC PANEL: Lab: 139 MMOL/L — ABNORMAL LOW (ref >60)

## 2018-01-22 MED ORDER — HALOPERIDOL LACTATE 5 MG/ML IJ SOLN
1 mg | Freq: Once | INTRAVENOUS | 0 refills | Status: AC | PRN
Start: 2018-01-22 — End: ?

## 2018-01-22 MED ORDER — ROCURONIUM 10 MG/ML IV SOLN
INTRAVENOUS | 0 refills | Status: DC
Start: 2018-01-22 — End: 2018-01-22
  Administered 2018-01-22: 18:00:00 50 mg via INTRAVENOUS

## 2018-01-22 MED ORDER — ONDANSETRON HCL (PF) 4 MG/2 ML IJ SOLN
INTRAVENOUS | 0 refills | Status: DC
Start: 2018-01-22 — End: 2018-01-22
  Administered 2018-01-22: 20:00:00 4 mg via INTRAVENOUS

## 2018-01-22 MED ORDER — FENTANYL CITRATE (PF) 50 MCG/ML IJ SOLN
0 refills | Status: DC
Start: 2018-01-22 — End: 2018-01-22
  Administered 2018-01-22: 18:00:00 100 ug via INTRAVENOUS

## 2018-01-22 MED ORDER — GLYCOPYRROLATE 0.2 MG/ML IJ SOLN
0 refills | Status: DC
Start: 2018-01-22 — End: 2018-01-22
  Administered 2018-01-22: 20:00:00 0.4 mg via INTRAVENOUS

## 2018-01-22 MED ORDER — LIDOCAINE (PF) 200 MG/10 ML (2 %) IJ SYRG
0 refills | Status: DC
Start: 2018-01-22 — End: 2018-01-22
  Administered 2018-01-22: 18:00:00 100 mg via INTRAVENOUS

## 2018-01-22 MED ORDER — HYDRALAZINE 20 MG/ML IJ SOLN
10 mg | INTRAVENOUS | 0 refills | Status: DC | PRN
Start: 2018-01-22 — End: 2018-01-24

## 2018-01-22 MED ORDER — FENTANYL CITRATE (PF) 50 MCG/ML IJ SOLN
25-50 ug | INTRAVENOUS | 0 refills | Status: DC | PRN
Start: 2018-01-22 — End: 2018-01-24

## 2018-01-22 MED ORDER — TAMSULOSIN 0.4 MG PO CAP
0.4 mg | Freq: Every day | ORAL | 0 refills | Status: DC
Start: 2018-01-22 — End: 2018-01-29
  Administered 2018-01-22 – 2018-01-29 (×8): 0.4 mg via ORAL

## 2018-01-22 MED ORDER — DEXAMETHASONE SODIUM PHOSPHATE 4 MG/ML IJ SOLN
INTRAVENOUS | 0 refills | Status: DC
Start: 2018-01-22 — End: 2018-01-22
  Administered 2018-01-22: 19:00:00 4 mg via INTRAVENOUS

## 2018-01-22 MED ORDER — LACTATED RINGERS IV SOLP
1000 mL | Freq: Once | INTRAVENOUS | 0 refills | Status: CP
Start: 2018-01-22 — End: ?
  Administered 2018-01-22: 22:00:00 1000 mL via INTRAVENOUS

## 2018-01-22 MED ORDER — NITROGLYCERIN(#) INJ 100MCG/ML IJ SOLN
0 refills | Status: CP
  Administered 2018-01-22: 19:00:00 200 ug via INTRA_ARTERIAL

## 2018-01-22 MED ORDER — LACTATED RINGERS IV SOLP
0 refills | Status: DC
Start: 2018-01-22 — End: 2018-01-22
  Administered 2018-01-22: 18:00:00 via INTRAVENOUS

## 2018-01-22 MED ORDER — CARVEDILOL 25 MG PO TAB
25 mg | Freq: Two times a day (BID) | ORAL | 0 refills | Status: DC
Start: 2018-01-22 — End: 2018-01-27
  Administered 2018-01-22 – 2018-01-27 (×9): 25 mg via ORAL

## 2018-01-22 MED ORDER — LEVETIRACETAM IN NACL (ISO-OS) 1,000 MG/100 ML IV PGBK
1000 mg | Freq: Two times a day (BID) | INTRAVENOUS | 0 refills | Status: DC
Start: 2018-01-22 — End: 2018-01-22

## 2018-01-22 MED ORDER — PROPOFOL INJ 10 MG/ML IV VIAL
0 refills | Status: DC
Start: 2018-01-22 — End: 2018-01-22
  Administered 2018-01-22: 18:00:00 100 mg via INTRAVENOUS

## 2018-01-22 MED ORDER — IOPAMIDOL 61 % IV SOLN
100 mL | Freq: Once | INTRA_ARTERIAL | 0 refills | Status: CP
Start: 2018-01-22 — End: ?
  Administered 2018-01-22: 21:00:00 130 mL via INTRA_ARTERIAL

## 2018-01-22 MED ORDER — HEPARIN (PORCINE) 1,000 UNIT/ML IJ SOLN
3 mL | Freq: Once | 0 refills | Status: DC
Start: 2018-01-22 — End: 2018-01-22

## 2018-01-22 MED ORDER — VERAPAMIL 2.5 MG/ML IV SOLN
0 refills | Status: CP
  Administered 2018-01-22: 19:00:00 2.5 mg via INTRAVENOUS

## 2018-01-22 MED ORDER — LEVETIRACETAM 150ML IVPB
2000 mg | Freq: Once | INTRAVENOUS | 0 refills | Status: DC
Start: 2018-01-22 — End: 2018-01-22

## 2018-01-22 MED ORDER — DEXTRAN 70-HYPROMELLOSE (PF) 0.1-0.3 % OP DPET
0 refills | Status: DC
Start: 2018-01-22 — End: 2018-01-22
  Administered 2018-01-22: 18:00:00 2 [drp] via OPHTHALMIC

## 2018-01-22 MED ORDER — NEOSTIGMINE METHYLSULFATE 1 MG/ML IJ SOLN
0 refills | Status: DC
Start: 2018-01-22 — End: 2018-01-22
  Administered 2018-01-22: 20:00:00 3 mg via INTRAVENOUS

## 2018-01-22 MED ORDER — EPHEDRINE SULFATE 50 MG/5ML SYR (10 MG/ML) (AN)(OSM)
0 refills | Status: DC
Start: 2018-01-22 — End: 2018-01-22
  Administered 2018-01-22 (×2): 10 mg via INTRAVENOUS

## 2018-01-23 ENCOUNTER — Encounter: Admit: 2018-01-23 | Discharge: 2018-01-23 | Payer: MEDICARE

## 2018-01-23 DIAGNOSIS — I2510 Atherosclerotic heart disease of native coronary artery without angina pectoris: ICD-10-CM

## 2018-01-23 DIAGNOSIS — E785 Hyperlipidemia, unspecified: ICD-10-CM

## 2018-01-23 DIAGNOSIS — I10 Essential (primary) hypertension: Principal | ICD-10-CM

## 2018-01-23 DIAGNOSIS — N189 Chronic kidney disease, unspecified: ICD-10-CM

## 2018-01-23 DIAGNOSIS — K859 Acute pancreatitis without necrosis or infection, unspecified: ICD-10-CM

## 2018-01-23 DIAGNOSIS — I739 Peripheral vascular disease, unspecified: ICD-10-CM

## 2018-01-23 DIAGNOSIS — R0600 Dyspnea, unspecified: ICD-10-CM

## 2018-01-23 DIAGNOSIS — F17200 Nicotine dependence, unspecified, uncomplicated: ICD-10-CM

## 2018-01-23 DIAGNOSIS — E119 Type 2 diabetes mellitus without complications: ICD-10-CM

## 2018-01-23 LAB — POC GLUCOSE
Lab: 322 mg/dL — ABNORMAL HIGH (ref 70–100)
Lab: 271 mg/dL — ABNORMAL HIGH (ref 70–100)
Lab: 263 mg/dL — ABNORMAL HIGH (ref 70–100)
Lab: 208 mg/dL — ABNORMAL HIGH (ref 70–100)

## 2018-01-23 LAB — CBC
Lab: 5.7 10*3/uL — ABNORMAL LOW (ref 4.5–11.0)
Lab: 2.6 M/UL — ABNORMAL LOW (ref >60)
Lab: 22 % — ABNORMAL LOW (ref 40–50)
Lab: 6.7 K/UL — ABNORMAL HIGH (ref >60)
Lab: 7.6 g/dL — ABNORMAL LOW (ref 13.5–16.5)

## 2018-01-23 LAB — PROTIME INR (PT): Lab: 1.1 MMOL/L — ABNORMAL LOW (ref 0.8–1.2)

## 2018-01-23 LAB — MAGNESIUM: Lab: 2.3 mg/dL — ABNORMAL LOW (ref 1.6–2.6)

## 2018-01-23 LAB — IONIZED CALCIUM,BG: Lab: 1.1 MMOL/L — ABNORMAL LOW (ref 1.0–1.3)

## 2018-01-23 LAB — COMPREHENSIVE METABOLIC PANEL: Lab: 137 MMOL/L — ABNORMAL LOW (ref >60)

## 2018-01-23 LAB — PHOSPHORUS: Lab: 4.9 mg/dL — ABNORMAL HIGH (ref 2.0–4.5)

## 2018-01-23 MED ORDER — NPH INSULIN HUMAN RECOMB 100 UNIT/ML (3 ML) SC PEN
10 [IU] | Freq: Once | SUBCUTANEOUS | 0 refills | Status: CP
Start: 2018-01-23 — End: ?
  Administered 2018-01-23: 14:00:00 10 [IU] via SUBCUTANEOUS

## 2018-01-23 MED ORDER — AMLODIPINE 10 MG PO TAB
10 mg | Freq: Every day | ORAL | 0 refills | Status: DC
Start: 2018-01-23 — End: 2018-01-31
  Administered 2018-01-23 – 2018-01-30 (×8): 10 mg via ORAL

## 2018-01-23 MED ORDER — INSULIN GLARGINE 100 UNIT/ML (3 ML) SC INJ PEN
13 [IU] | Freq: Every evening | SUBCUTANEOUS | 0 refills | Status: DC
Start: 2018-01-23 — End: 2018-01-23

## 2018-01-23 MED ORDER — BENZOCAINE-MENTHOL 6-10 MG MM LOZG
1 | ORAL | 0 refills | Status: DC | PRN
Start: 2018-01-23 — End: 2018-01-31
  Administered 2018-01-23: 07:00:00 1 via ORAL

## 2018-01-23 MED ORDER — INSULIN ASPART 100 UNIT/ML SC FLEXPEN
5 [IU] | Freq: Three times a day (TID) | SUBCUTANEOUS | 0 refills | Status: DC
Start: 2018-01-23 — End: 2018-01-31

## 2018-01-23 MED ORDER — INSULIN GLARGINE 100 UNIT/ML (3 ML) SC INJ PEN
10 [IU] | Freq: Every evening | SUBCUTANEOUS | 0 refills | Status: DC
Start: 2018-01-23 — End: 2018-01-31
  Administered 2018-01-24 – 2018-01-29 (×2): 10 [IU] via SUBCUTANEOUS

## 2018-01-24 ENCOUNTER — Encounter: Admit: 2018-01-24 | Discharge: 2018-01-24 | Payer: MEDICARE

## 2018-01-24 ENCOUNTER — Inpatient Hospital Stay: Admit: 2018-01-24 | Discharge: 2018-01-24 | Payer: MEDICARE

## 2018-01-24 DIAGNOSIS — K922 Gastrointestinal hemorrhage, unspecified: ICD-10-CM

## 2018-01-24 LAB — POC GLUCOSE
Lab: 162 mg/dL — ABNORMAL HIGH (ref 70–100)
Lab: 108 mg/dL — ABNORMAL HIGH (ref 70–100)
Lab: 147 mg/dL — ABNORMAL HIGH (ref 70–100)
Lab: 133 mg/dL — ABNORMAL HIGH (ref 70–100)

## 2018-01-24 LAB — CBC
Lab: 6.7 K/UL — ABNORMAL LOW (ref 4.5–11.0)
Lab: 111 10*3/uL — ABNORMAL LOW (ref 150–400)
Lab: 15 % — ABNORMAL HIGH (ref 11–15)
Lab: 2.6 M/UL — ABNORMAL LOW (ref 4.4–5.5)
Lab: 24 % — ABNORMAL LOW (ref 40–50)
Lab: 30 pg (ref 26–34)
Lab: 33 g/dL (ref 32.0–36.0)
Lab: 7.2 10*3/uL (ref 4.5–11.0)
Lab: 8.2 g/dL — ABNORMAL LOW (ref 13.5–16.5)
Lab: 9.9 FL (ref 7–11)
Lab: 90 FL (ref 80–100)

## 2018-01-24 LAB — PHOSPHORUS: Lab: 3.5 mg/dL — ABNORMAL HIGH (ref 2.0–4.5)

## 2018-01-24 LAB — MAGNESIUM: Lab: 2.2 mg/dL — ABNORMAL LOW (ref >60)

## 2018-01-24 LAB — COMPREHENSIVE METABOLIC PANEL
Lab: 138 MMOL/L — ABNORMAL LOW (ref >60)
Lab: 139 mg/dL — ABNORMAL HIGH (ref 70–100)
Lab: 4.4 g/dL — ABNORMAL LOW (ref >60)
Lab: 7.2 mg/dL — ABNORMAL LOW (ref >60)

## 2018-01-24 LAB — IONIZED CALCIUM,BG: Lab: 1.1 MMOL/L — ABNORMAL LOW (ref 1.0–1.3)

## 2018-01-24 LAB — PROTIME INR (PT): Lab: 1.1 g/dL — ABNORMAL LOW (ref >60)

## 2018-01-24 MED ORDER — SODIUM CHLORIDE 0.9 % IV SOLP
INTRAVENOUS | 0 refills | Status: CN
Start: 2018-01-24 — End: ?

## 2018-01-24 MED ORDER — ROPINIROLE 2 MG PO TAB
4 mg | Freq: Every evening | ORAL | 0 refills | Status: DC
Start: 2018-01-24 — End: 2018-01-31
  Administered 2018-01-25 – 2018-01-30 (×6): 4 mg via ORAL

## 2018-01-24 MED ORDER — PEG-ELECTROLYTE SOLN 420 GRAM PO SOLR
2 L | Freq: Once | ORAL | 0 refills | Status: CP
Start: 2018-01-24 — End: ?
  Administered 2018-01-24: 19:00:00 2 L via ORAL

## 2018-01-25 ENCOUNTER — Encounter: Admit: 2018-01-25 | Discharge: 2018-01-25 | Payer: MEDICARE

## 2018-01-25 DIAGNOSIS — K922 Gastrointestinal hemorrhage, unspecified: ICD-10-CM

## 2018-01-25 DIAGNOSIS — E785 Hyperlipidemia, unspecified: ICD-10-CM

## 2018-01-25 DIAGNOSIS — R0600 Dyspnea, unspecified: ICD-10-CM

## 2018-01-25 DIAGNOSIS — E119 Type 2 diabetes mellitus without complications: ICD-10-CM

## 2018-01-25 DIAGNOSIS — I739 Peripheral vascular disease, unspecified: ICD-10-CM

## 2018-01-25 DIAGNOSIS — I10 Essential (primary) hypertension: Principal | ICD-10-CM

## 2018-01-25 DIAGNOSIS — I2510 Atherosclerotic heart disease of native coronary artery without angina pectoris: ICD-10-CM

## 2018-01-25 DIAGNOSIS — N189 Chronic kidney disease, unspecified: ICD-10-CM

## 2018-01-25 DIAGNOSIS — F17200 Nicotine dependence, unspecified, uncomplicated: ICD-10-CM

## 2018-01-25 DIAGNOSIS — K859 Acute pancreatitis without necrosis or infection, unspecified: ICD-10-CM

## 2018-01-25 LAB — POC GLUCOSE
Lab: 167 mg/dL — ABNORMAL HIGH (ref 70–100)
Lab: 189 mg/dL — ABNORMAL HIGH (ref 70–100)
Lab: 183 mg/dL — ABNORMAL HIGH (ref 70–100)
Lab: 174 mg/dL — ABNORMAL HIGH (ref 70–100)
Lab: 176 mg/dL — ABNORMAL HIGH (ref 70–100)
Lab: 173 mg/dL — ABNORMAL HIGH (ref 70–100)

## 2018-01-25 LAB — CBC: Lab: 6.5 K/UL — ABNORMAL LOW (ref >60)

## 2018-01-25 LAB — IONIZED CALCIUM,BG: Lab: 0.9 MMOL/L — ABNORMAL LOW (ref >60)

## 2018-01-25 LAB — PROTIME INR (PT): Lab: 1.1 MMOL/L (ref 0.8–1.2)

## 2018-01-25 LAB — MAGNESIUM: Lab: 2.1 mg/dL — ABNORMAL HIGH (ref 1.6–2.6)

## 2018-01-25 LAB — PHOSPHORUS: Lab: 3.4 mg/dL — ABNORMAL LOW (ref >60)

## 2018-01-25 LAB — COMPREHENSIVE METABOLIC PANEL: Lab: 136 MMOL/L — ABNORMAL LOW (ref 137–147)

## 2018-01-25 MED ORDER — PROPOFOL 10 MG/ML IV EMUL 20 ML (INFUSION)(AM)(OR)
INTRAVENOUS | 0 refills | Status: DC
Start: 2018-01-25 — End: 2018-01-25
  Administered 2018-01-25: 15:00:00 130 ug/kg/min via INTRAVENOUS
  Administered 2018-01-25: 15:00:00 20.000 mL via INTRAVENOUS

## 2018-01-25 MED ORDER — LACTATED RINGERS IV SOLP
0 refills | Status: DC
Start: 2018-01-25 — End: 2018-01-25
  Administered 2018-01-25: 14:00:00 via INTRAVENOUS

## 2018-01-25 MED ORDER — MIDAZOLAM 1 MG/ML IJ SOLN
INTRAVENOUS | 0 refills | Status: DC
Start: 2018-01-25 — End: 2018-01-25
  Administered 2018-01-25: 14:00:00 2 mg via INTRAVENOUS

## 2018-01-25 MED ORDER — PROPOFOL INJ 10 MG/ML IV VIAL
0 refills | Status: DC
Start: 2018-01-25 — End: 2018-01-25
  Administered 2018-01-25: 15:00:00 50 mg via INTRAVENOUS
  Administered 2018-01-25: 15:00:00 100 mg via INTRAVENOUS
  Administered 2018-01-25: 15:00:00 30 mg via INTRAVENOUS
  Administered 2018-01-25: 15:00:00 20 mg via INTRAVENOUS

## 2018-01-25 MED ORDER — LIDOCAINE (PF) 200 MG/10 ML (2 %) IJ SYRG
0 refills | Status: DC
Start: 2018-01-25 — End: 2018-01-25
  Administered 2018-01-25: 15:00:00 40 mg via INTRAVENOUS

## 2018-01-25 MED ORDER — TAP WATER/SIMETHICONE IRRIGATION
0 refills | Status: DC
Start: 2018-01-25 — End: 2018-01-25
  Administered 2018-01-25: 15:00:00 120 mL

## 2018-01-26 ENCOUNTER — Inpatient Hospital Stay: Admit: 2018-01-26 | Discharge: 2018-01-26 | Payer: MEDICARE

## 2018-01-26 LAB — POC GLUCOSE
Lab: 170 mg/dL — ABNORMAL HIGH (ref 70–100)
Lab: 78 mg/dL (ref 70–100)
Lab: 166 mg/dL — ABNORMAL HIGH (ref 70–100)
Lab: 252 mg/dL — ABNORMAL HIGH (ref 70–100)

## 2018-01-26 LAB — CBC: Lab: 4.8 K/UL — ABNORMAL LOW (ref >60)

## 2018-01-26 LAB — IONIZED CALCIUM,BG: Lab: 1.1 MMOL/L — ABNORMAL HIGH (ref >60)

## 2018-01-26 LAB — MAGNESIUM: Lab: 2.2 mg/dL — ABNORMAL HIGH (ref >60)

## 2018-01-26 LAB — COMPREHENSIVE METABOLIC PANEL: Lab: 138 MMOL/L — ABNORMAL LOW (ref >60)

## 2018-01-26 LAB — PROTIME INR (PT): Lab: 1.2 g/dL — ABNORMAL LOW (ref 0.8–1.2)

## 2018-01-26 LAB — PHOSPHORUS: Lab: 2.7 mg/dL — ABNORMAL HIGH (ref 2.0–4.5)

## 2018-01-27 ENCOUNTER — Encounter: Admit: 2018-01-27 | Discharge: 2018-01-27 | Payer: MEDICARE

## 2018-01-27 DIAGNOSIS — K922 Gastrointestinal hemorrhage, unspecified: Secondary | ICD-10-CM

## 2018-01-27 LAB — COMPREHENSIVE METABOLIC PANEL: Lab: 137 MMOL/L — ABNORMAL LOW (ref >60)

## 2018-01-27 LAB — POC GLUCOSE
Lab: 234 mg/dL — ABNORMAL HIGH (ref 70–100)
Lab: 122 mg/dL — ABNORMAL HIGH (ref 70–100)
Lab: 191 mg/dL — ABNORMAL HIGH (ref >60)
Lab: 173 mg/dL — ABNORMAL HIGH (ref 70–100)

## 2018-01-27 LAB — MAGNESIUM: Lab: 2.2 mg/dL — ABNORMAL HIGH (ref >60)

## 2018-01-27 LAB — IONIZED CALCIUM,BG: Lab: 1.1 MMOL/L — ABNORMAL LOW (ref 1.0–1.3)

## 2018-01-27 LAB — CBC: Lab: 5.7 K/UL — ABNORMAL HIGH (ref >60)

## 2018-01-27 LAB — PHOSPHORUS: Lab: 3.2 mg/dL — ABNORMAL HIGH (ref >60)

## 2018-01-27 MED ORDER — LACTATED RINGERS IV SOLP
1000 mL | Freq: Once | INTRAVENOUS | 0 refills | Status: CP
Start: 2018-01-27 — End: ?
  Administered 2018-01-27: 15:00:00 1000 mL via INTRAVENOUS

## 2018-01-27 MED ORDER — IOHEXOL 350 MG IODINE/ML IV SOLN
80 mL | Freq: Once | INTRAVENOUS | 0 refills | Status: CP
Start: 2018-01-27 — End: ?
  Administered 2018-01-27: 16:00:00 80 mL via INTRAVENOUS

## 2018-01-27 MED ORDER — SODIUM CHLORIDE 0.9 % IJ SOLN
50 mL | Freq: Once | INTRAVENOUS | 0 refills | Status: CP
Start: 2018-01-27 — End: ?
  Administered 2018-01-27: 16:00:00 50 mL via INTRAVENOUS

## 2018-01-27 MED ORDER — IMS MIXTURE TEMPLATE
37.5 mg | Freq: Two times a day (BID) | ORAL | 0 refills | Status: DC
Start: 2018-01-27 — End: 2018-01-31
  Administered 2018-01-27 – 2018-01-30 (×14): 37.5 mg via ORAL

## 2018-01-28 ENCOUNTER — Encounter: Admit: 2018-01-28 | Discharge: 2018-01-28 | Payer: MEDICARE

## 2018-01-28 DIAGNOSIS — R0600 Dyspnea, unspecified: ICD-10-CM

## 2018-01-28 DIAGNOSIS — E785 Hyperlipidemia, unspecified: ICD-10-CM

## 2018-01-28 DIAGNOSIS — E119 Type 2 diabetes mellitus without complications: ICD-10-CM

## 2018-01-28 DIAGNOSIS — K859 Acute pancreatitis without necrosis or infection, unspecified: ICD-10-CM

## 2018-01-28 DIAGNOSIS — N189 Chronic kidney disease, unspecified: ICD-10-CM

## 2018-01-28 DIAGNOSIS — F17200 Nicotine dependence, unspecified, uncomplicated: ICD-10-CM

## 2018-01-28 DIAGNOSIS — I2510 Atherosclerotic heart disease of native coronary artery without angina pectoris: ICD-10-CM

## 2018-01-28 DIAGNOSIS — I739 Peripheral vascular disease, unspecified: ICD-10-CM

## 2018-01-28 DIAGNOSIS — I10 Essential (primary) hypertension: Principal | ICD-10-CM

## 2018-01-28 LAB — CULTURE-FUNGAL,OTHER

## 2018-01-28 LAB — CBC: Lab: 4.7 K/UL — ABNORMAL HIGH (ref 4.5–11.0)

## 2018-01-28 LAB — COMPREHENSIVE METABOLIC PANEL: Lab: 138 MMOL/L — ABNORMAL HIGH (ref 137–147)

## 2018-01-28 LAB — POC GLUCOSE
Lab: 164 mg/dL — ABNORMAL HIGH (ref 70–100)
Lab: 96 mg/dL (ref 70–100)
Lab: 98 mg/dL (ref 70–100)
Lab: 138 mg/dL — ABNORMAL HIGH (ref >60)

## 2018-01-28 LAB — MAGNESIUM: Lab: 2 mg/dL — ABNORMAL LOW (ref 1.6–2.6)

## 2018-01-28 LAB — IONIZED CALCIUM,BG: Lab: 1.1 MMOL/L — ABNORMAL LOW (ref >60)

## 2018-01-28 LAB — PHOSPHORUS: Lab: 3.4 mg/dL — ABNORMAL LOW (ref >60)

## 2018-01-28 MED ORDER — FERROUS SULFATE 325 MG (65 MG IRON) PO TAB
650 mg | Freq: Three times a day (TID) | ORAL | 0 refills | Status: DC
Start: 2018-01-28 — End: 2018-01-31
  Administered 2018-01-29 – 2018-01-30 (×6): 650 mg via ORAL

## 2018-01-28 MED ORDER — PNEUMOCOCCAL 23-VAL PS VACCINE 25 MCG/0.5 ML IJ SOLN
.5 mL | Freq: Once | INTRAMUSCULAR | 0 refills | Status: CP
Start: 2018-01-28 — End: ?
  Administered 2018-01-28: 19:00:00 0.5 mL via INTRAMUSCULAR

## 2018-01-28 MED ORDER — FUROSEMIDE 40 MG PO TAB
40 mg | Freq: Every day | ORAL | 0 refills | Status: DC
Start: 2018-01-28 — End: 2018-01-29
  Administered 2018-01-28 – 2018-01-29 (×2): 40 mg via ORAL

## 2018-01-29 LAB — POC GLUCOSE
Lab: 138 mg/dL — ABNORMAL HIGH (ref 70–100)
Lab: 93 mg/dL (ref 70–100)
Lab: 102 mg/dL — ABNORMAL HIGH (ref 70–100)
Lab: 91 mg/dL (ref 70–100)

## 2018-01-29 LAB — COMPREHENSIVE METABOLIC PANEL: Lab: 136 MMOL/L — ABNORMAL LOW (ref 137–147)

## 2018-01-29 LAB — CBC: Lab: 4 K/UL — ABNORMAL LOW (ref >60)

## 2018-01-29 LAB — PHOSPHORUS: Lab: 3.8 mg/dL — ABNORMAL HIGH (ref 2.0–4.5)

## 2018-01-29 LAB — MAGNESIUM: Lab: 2 mg/dL — ABNORMAL HIGH (ref >60)

## 2018-01-29 MED ORDER — FUROSEMIDE 40 MG PO TAB
40 mg | Freq: Every day | ORAL | 0 refills | Status: CN
Start: 2018-01-29 — End: ?

## 2018-01-29 MED ORDER — FUROSEMIDE 40 MG PO TAB
40 mg | Freq: Two times a day (BID) | ORAL | 0 refills | Status: DC
Start: 2018-01-29 — End: 2018-01-31
  Administered 2018-01-29 – 2018-01-30 (×2): 40 mg via ORAL

## 2018-01-29 MED ORDER — TAMSULOSIN 0.4 MG PO CAP
0.4 mg | Freq: Two times a day (BID) | ORAL | 0 refills | Status: DC
Start: 2018-01-29 — End: 2018-01-31
  Administered 2018-01-30 (×2): 0.4 mg via ORAL

## 2018-01-29 MED ORDER — TAMSULOSIN 0.4 MG PO CAP
0.4 mg | Freq: Every day | ORAL | 0 refills | Status: CN
Start: 2018-01-29 — End: ?

## 2018-01-30 ENCOUNTER — Inpatient Hospital Stay: Admit: 2018-01-21 | Discharge: 2018-01-21 | Payer: MEDICARE

## 2018-01-30 ENCOUNTER — Inpatient Hospital Stay: Admit: 2018-01-27 | Discharge: 2018-01-28 | Payer: MEDICARE

## 2018-01-30 ENCOUNTER — Inpatient Hospital Stay: Admit: 2018-01-22 | Discharge: 2018-01-22 | Payer: MEDICARE

## 2018-01-30 ENCOUNTER — Inpatient Hospital Stay: Admit: 2018-01-25 | Discharge: 2018-01-25 | Payer: MEDICARE

## 2018-01-30 DIAGNOSIS — E1122 Type 2 diabetes mellitus with diabetic chronic kidney disease: ICD-10-CM

## 2018-01-30 DIAGNOSIS — K861 Other chronic pancreatitis: ICD-10-CM

## 2018-01-30 DIAGNOSIS — Z23 Encounter for immunization: ICD-10-CM

## 2018-01-30 DIAGNOSIS — I868 Varicose veins of other specified sites: Principal | ICD-10-CM

## 2018-01-30 DIAGNOSIS — D62 Acute posthemorrhagic anemia: ICD-10-CM

## 2018-01-30 DIAGNOSIS — I130 Hypertensive heart and chronic kidney disease with heart failure and stage 1 through stage 4 chronic kidney disease, or unspecified chronic kidney disease: ICD-10-CM

## 2018-01-30 DIAGNOSIS — I864 Gastric varices: Secondary | ICD-10-CM

## 2018-01-30 DIAGNOSIS — I5032 Chronic diastolic (congestive) heart failure: ICD-10-CM

## 2018-01-30 DIAGNOSIS — I2510 Atherosclerotic heart disease of native coronary artery without angina pectoris: ICD-10-CM

## 2018-01-30 DIAGNOSIS — Z794 Long term (current) use of insulin: ICD-10-CM

## 2018-01-30 DIAGNOSIS — K766 Portal hypertension: ICD-10-CM

## 2018-01-30 DIAGNOSIS — N184 Chronic kidney disease, stage 4 (severe): ICD-10-CM

## 2018-01-30 DIAGNOSIS — E1151 Type 2 diabetes mellitus with diabetic peripheral angiopathy without gangrene: ICD-10-CM

## 2018-01-30 LAB — MAGNESIUM: Lab: 1.9 mg/dL — ABNORMAL LOW (ref 1.6–2.6)

## 2018-01-30 LAB — POC GLUCOSE
Lab: 161 mg/dL — ABNORMAL HIGH (ref 70–100)
Lab: 116 mg/dL — ABNORMAL HIGH (ref 70–100)
Lab: 171 mg/dL — ABNORMAL HIGH (ref 70–100)

## 2018-01-30 LAB — BASIC METABOLIC PANEL
Lab: 109 MMOL/L — ABNORMAL LOW (ref 98–110)
Lab: 137 MMOL/L — ABNORMAL LOW (ref 137–147)
Lab: 25 MMOL/L (ref 21–30)
Lab: 3 pg (ref 3–12)

## 2018-01-30 LAB — IONIZED CALCIUM,BG: Lab: 1.1 MMOL/L — ABNORMAL HIGH (ref 1.0–1.3)

## 2018-01-30 LAB — CBC AND DIFF: Lab: 3.3 10*3/uL — ABNORMAL LOW (ref 4.5–11.0)

## 2018-01-30 MED ORDER — OXYCODONE 5 MG PO TAB
5-10 mg | ORAL_TABLET | ORAL | 0 refills | 6.00000 days | Status: AC | PRN
Start: 2018-01-30 — End: 2018-02-22

## 2018-01-30 MED ORDER — FUROSEMIDE 40 MG PO TAB
40 mg | ORAL_TABLET | Freq: Every morning | ORAL | 0 refills | 90.00000 days | Status: AC
Start: 2018-01-30 — End: 2018-02-15

## 2018-01-30 MED ORDER — FUROSEMIDE 40 MG PO TAB
40 mg | Freq: Two times a day (BID) | ORAL | 0 refills | 90.00000 days | Status: AC
Start: 2018-01-30 — End: 2018-01-30

## 2018-01-30 MED ORDER — TAMSULOSIN 0.4 MG PO CAP
0.4 mg | Freq: Two times a day (BID) | ORAL | 0 refills | Status: SS
Start: 2018-01-30 — End: 2018-02-18

## 2018-01-30 MED ORDER — ATORVASTATIN 40 MG PO TAB
40 mg | Freq: Every evening | ORAL | 0 refills | Status: AC
Start: 2018-01-30 — End: ?

## 2018-01-30 MED ORDER — CARVEDILOL 12.5 MG PO TAB
37.5 mg | Freq: Two times a day (BID) | ORAL | 0 refills | Status: SS
Start: 2018-01-30 — End: 2018-02-11

## 2018-01-30 MED ORDER — GABAPENTIN 300 MG PO CAP
300 mg | Freq: Three times a day (TID) | ORAL | 0 refills | Status: AC
Start: 2018-01-30 — End: 2018-02-25

## 2018-01-31 ENCOUNTER — Encounter: Admit: 2018-01-31 | Discharge: 2018-01-31 | Payer: MEDICARE

## 2018-02-01 ENCOUNTER — Ambulatory Visit: Admit: 2018-02-01 | Discharge: 2018-02-01 | Payer: MEDICARE

## 2018-02-04 ENCOUNTER — Ambulatory Visit: Admit: 2018-02-04 | Discharge: 2018-02-04 | Payer: MEDICARE

## 2018-02-07 ENCOUNTER — Encounter: Admit: 2018-02-07 | Discharge: 2018-02-07 | Payer: MEDICARE

## 2018-02-07 ENCOUNTER — Ambulatory Visit: Admit: 2018-02-07 | Discharge: 2018-02-07 | Payer: MEDICARE

## 2018-02-07 DIAGNOSIS — F17200 Nicotine dependence, unspecified, uncomplicated: ICD-10-CM

## 2018-02-07 DIAGNOSIS — K859 Acute pancreatitis without necrosis or infection, unspecified: ICD-10-CM

## 2018-02-07 DIAGNOSIS — E785 Hyperlipidemia, unspecified: ICD-10-CM

## 2018-02-07 DIAGNOSIS — E119 Type 2 diabetes mellitus without complications: ICD-10-CM

## 2018-02-07 DIAGNOSIS — I10 Essential (primary) hypertension: Principal | ICD-10-CM

## 2018-02-07 DIAGNOSIS — R0600 Dyspnea, unspecified: Secondary | ICD-10-CM

## 2018-02-07 DIAGNOSIS — N189 Chronic kidney disease, unspecified: ICD-10-CM

## 2018-02-07 DIAGNOSIS — I2510 Atherosclerotic heart disease of native coronary artery without angina pectoris: ICD-10-CM

## 2018-02-07 DIAGNOSIS — I739 Peripheral vascular disease, unspecified: ICD-10-CM

## 2018-02-08 ENCOUNTER — Encounter: Admit: 2018-02-08 | Discharge: 2018-02-08 | Payer: MEDICARE

## 2018-02-10 ENCOUNTER — Encounter: Admit: 2018-02-10 | Discharge: 2018-02-10 | Payer: MEDICARE

## 2018-02-10 ENCOUNTER — Encounter: Admit: 2018-02-10 | Discharge: 2018-02-11 | Payer: MEDICARE

## 2018-02-10 DIAGNOSIS — R0600 Dyspnea, unspecified: ICD-10-CM

## 2018-02-10 MED ORDER — ROPINIROLE 1 MG PO TAB
4 mg | Freq: Every evening | ORAL | 0 refills | Status: DC
Start: 2018-02-10 — End: 2018-02-16
  Administered 2018-02-11 – 2018-02-15 (×5): 4 mg via ORAL

## 2018-02-10 MED ORDER — CARVEDILOL 12.5 MG PO TAB
37.5 mg | Freq: Two times a day (BID) | ORAL | 0 refills | Status: DC
Start: 2018-02-10 — End: 2018-02-16
  Administered 2018-02-11 – 2018-02-15 (×11): 37.5 mg via ORAL

## 2018-02-10 MED ORDER — GABAPENTIN 300 MG PO CAP
300 mg | Freq: Three times a day (TID) | ORAL | 0 refills | Status: DC
Start: 2018-02-10 — End: 2018-02-16
  Administered 2018-02-11 – 2018-02-15 (×15): 300 mg via ORAL

## 2018-02-10 MED ORDER — ENOXAPARIN 40 MG/0.4 ML SC SYRG
40 mg | Freq: Every day | SUBCUTANEOUS | 0 refills | Status: DC
Start: 2018-02-10 — End: 2018-02-11
  Administered 2018-02-11: 03:00:00 40 mg via SUBCUTANEOUS

## 2018-02-10 MED ORDER — POLYETHYLENE GLYCOL 3350 17 GRAM PO PWPK
1 | Freq: Every day | ORAL | 0 refills | Status: DC
Start: 2018-02-10 — End: 2018-02-16
  Administered 2018-02-11 – 2018-02-13 (×3): 17 g via ORAL

## 2018-02-10 MED ORDER — DOCUSATE SODIUM 100 MG PO CAP
100 mg | Freq: Two times a day (BID) | ORAL | 0 refills | Status: DC | PRN
Start: 2018-02-10 — End: 2018-02-16

## 2018-02-10 MED ORDER — TAMSULOSIN 0.4 MG PO CAP
0.4 mg | Freq: Two times a day (BID) | ORAL | 0 refills | Status: DC
Start: 2018-02-10 — End: 2018-02-16
  Administered 2018-02-11 – 2018-02-15 (×10): 0.4 mg via ORAL

## 2018-02-10 MED ORDER — FUROSEMIDE 10 MG/ML IJ SOLN
80 mg | Freq: Once | INTRAVENOUS | 0 refills | Status: CP
Start: 2018-02-10 — End: ?
  Administered 2018-02-11: 03:00:00 80 mg via INTRAVENOUS

## 2018-02-10 MED ORDER — FOLIC ACID 1 MG PO TAB
1 mg | Freq: Every day | ORAL | 0 refills | Status: DC
Start: 2018-02-10 — End: 2018-02-16
  Administered 2018-02-11 – 2018-02-15 (×5): 1 mg via ORAL

## 2018-02-10 MED ORDER — ASPIRIN 81 MG PO CHEW
81 mg | Freq: Every day | ORAL | 0 refills | Status: DC
Start: 2018-02-10 — End: 2018-02-16
  Administered 2018-02-14 – 2018-02-15 (×2): 81 mg via ORAL

## 2018-02-10 MED ORDER — INSULIN GLARGINE 100 UNIT/ML (3 ML) SC INJ PEN
10 [IU] | Freq: Every evening | SUBCUTANEOUS | 0 refills | Status: DC
Start: 2018-02-10 — End: 2018-02-12
  Administered 2018-02-11: 04:00:00 10 [IU] via SUBCUTANEOUS

## 2018-02-10 MED ORDER — CITALOPRAM 20 MG PO TAB
40 mg | Freq: Every evening | ORAL | 0 refills | Status: DC
Start: 2018-02-10 — End: 2018-02-16
  Administered 2018-02-11 – 2018-02-15 (×5): 40 mg via ORAL

## 2018-02-10 MED ORDER — INSULIN ASPART 100 UNIT/ML SC FLEXPEN
5 [IU] | Freq: Three times a day (TID) | SUBCUTANEOUS | 0 refills | Status: DC
Start: 2018-02-10 — End: 2018-02-16
  Administered 2018-02-11: 14:00:00 5 [IU] via SUBCUTANEOUS

## 2018-02-10 MED ORDER — ATORVASTATIN 40 MG PO TAB
40 mg | Freq: Every evening | ORAL | 0 refills | Status: DC
Start: 2018-02-10 — End: 2018-02-16
  Administered 2018-02-11 – 2018-02-15 (×5): 40 mg via ORAL

## 2018-02-10 MED ORDER — FERROUS SULFATE 325 MG (65 MG IRON) PO TAB
650 mg | Freq: Three times a day (TID) | ORAL | 0 refills | Status: DC
Start: 2018-02-10 — End: 2018-02-16
  Administered 2018-02-11 – 2018-02-15 (×14): 650 mg via ORAL

## 2018-02-10 MED ORDER — PANTOPRAZOLE 40 MG PO TBEC
40 mg | Freq: Two times a day (BID) | ORAL | 0 refills | Status: DC
Start: 2018-02-10 — End: 2018-02-16
  Administered 2018-02-11 – 2018-02-15 (×10): 40 mg via ORAL

## 2018-02-11 LAB — CBC AND DIFF
Lab: 0 10*3/uL (ref 0–0.20)
Lab: 0.1 10*3/uL (ref 0–0.45)
Lab: 4.1 10*3/uL — ABNORMAL LOW (ref 4.5–11.0)

## 2018-02-11 LAB — TROPONIN-I

## 2018-02-11 LAB — COMPREHENSIVE METABOLIC PANEL
Lab: 0.3 mg/dL (ref 0.3–1.2)
Lab: 104 MMOL/L — ABNORMAL LOW (ref 98–110)
Lab: 138 MMOL/L — ABNORMAL LOW (ref 137–147)
Lab: 2.1 mg/dL — ABNORMAL HIGH (ref 0.4–1.24)
Lab: 2.7 g/dL — ABNORMAL LOW (ref 3.5–5.0)
Lab: 202 mg/dL — ABNORMAL HIGH (ref 70–100)
Lab: 27 MMOL/L (ref 21–30)
Lab: 31 mL/min — ABNORMAL LOW (ref >60)
Lab: 4.3 MMOL/L — ABNORMAL LOW (ref 3.5–5.1)
Lab: 5.1 g/dL — ABNORMAL LOW (ref 6.0–8.0)
Lab: 61 mg/dL — ABNORMAL HIGH (ref 7–25)
Lab: 8.1 mg/dL — ABNORMAL LOW (ref 8.5–10.6)
Lab: 9 U/L (ref 7–40)
Lab: 92 U/L — ABNORMAL LOW (ref 25–110)

## 2018-02-11 LAB — POC GLUCOSE
Lab: 226 mg/dL — ABNORMAL HIGH (ref 70–100)
Lab: 148 mg/dL — ABNORMAL HIGH (ref 70–100)
Lab: 237 mg/dL — ABNORMAL HIGH (ref 70–100)
Lab: 290 mg/dL — ABNORMAL HIGH (ref 70–100)

## 2018-02-11 LAB — BASIC METABOLIC PANEL: Lab: 139 MMOL/L — ABNORMAL LOW (ref >60)

## 2018-02-11 LAB — BNP (B-TYPE NATRIURETIC PEPTI): Lab: 431 pg/mL — ABNORMAL HIGH (ref >60)

## 2018-02-11 LAB — CBC: Lab: 3.1 K/UL — ABNORMAL LOW (ref 4.5–11.0)

## 2018-02-11 MED ORDER — ALBUTEROL SULFATE 2.5 MG /3 ML (0.083 %) IN NEBU
2.5 mg | RESPIRATORY_TRACT | 0 refills | Status: DC | PRN
Start: 2018-02-11 — End: 2018-02-16
  Administered 2018-02-12 – 2018-02-15 (×2): 2.5 mg via RESPIRATORY_TRACT

## 2018-02-11 MED ORDER — FUROSEMIDE 10 MG/ML IJ SOLN
40 mg | Freq: Two times a day (BID) | INTRAVENOUS | 0 refills | Status: DC
Start: 2018-02-11 — End: 2018-02-14
  Administered 2018-02-11 – 2018-02-14 (×7): 40 mg via INTRAVENOUS

## 2018-02-11 MED ORDER — LORAZEPAM 0.5 MG PO TAB
.5 mg | Freq: Once | ORAL | 0 refills | Status: CP
Start: 2018-02-11 — End: ?
  Administered 2018-02-11: 23:00:00 0.5 mg via ORAL

## 2018-02-11 MED ORDER — LORAZEPAM 1 MG PO TAB
1 mg | ORAL | 0 refills | Status: DC | PRN
Start: 2018-02-11 — End: 2018-02-16
  Administered 2018-02-12 – 2018-02-15 (×3): 1 mg via ORAL

## 2018-02-11 MED ORDER — LORAZEPAM 0.5 MG PO TAB
.5 mg | ORAL | 0 refills | Status: DC | PRN
Start: 2018-02-11 — End: 2018-02-11
  Administered 2018-02-11: 20:00:00 0.5 mg via ORAL

## 2018-02-11 MED ADMIN — ALBUTEROL SULFATE 2.5 MG /3 ML (0.083 %) IN NEBU [250]: 2.5 mg | RESPIRATORY_TRACT | @ 20:00:00 | Stop: 2018-02-11 | NDC 00487950101

## 2018-02-12 DIAGNOSIS — R0600 Dyspnea, unspecified: ICD-10-CM

## 2018-02-12 LAB — CBC AND DIFF
Lab: 0 10*3/uL (ref 0–0.20)
Lab: 0.1 10*3/uL (ref 0–0.45)
Lab: 0.3 10*3/uL (ref 0–0.80)
Lab: 0.5 10*3/uL — ABNORMAL LOW (ref 1.0–4.8)
Lab: 1 % (ref 0–2)
Lab: 1 % (ref 0–5)
Lab: 12 % — ABNORMAL LOW (ref 24–44)
Lab: 15 % — ABNORMAL HIGH (ref 11–15)
Lab: 2.4 M/UL — ABNORMAL LOW (ref 4.4–5.5)
Lab: 21 % — ABNORMAL LOW (ref 40–50)
Lab: 3.3 10*3/uL (ref 1.8–7.0)
Lab: 30 pg (ref 26–34)
Lab: 33 g/dL (ref 32.0–36.0)
Lab: 4.2 10*3/uL — ABNORMAL LOW (ref 4.5–11.0)
Lab: 7 % (ref 4–12)
Lab: 7.4 g/dL — ABNORMAL LOW (ref 13.5–16.5)
Lab: 79 % — ABNORMAL HIGH (ref 41–77)
Lab: 81 10*3/uL — ABNORMAL LOW (ref 150–400)
Lab: 89 FL (ref 80–100)
Lab: 9.7 FL (ref 7–11)

## 2018-02-12 LAB — LACTIC ACID (BG - RAPID LACTATE): Lab: 0.6 MMOL/L (ref 0.5–2.0)

## 2018-02-12 LAB — URINALYSIS DIPSTICK REFLEX TO CULTURE
Lab: NEGATIVE
Lab: NEGATIVE
Lab: NEGATIVE
Lab: NEGATIVE

## 2018-02-12 LAB — URINALYSIS MICROSCOPIC REFLEX TO CULTURE

## 2018-02-12 LAB — CBC
Lab: 2.2 M/UL — ABNORMAL LOW (ref 4.4–5.5)
Lab: 3.6 K/UL — ABNORMAL LOW (ref >60)

## 2018-02-12 LAB — BASIC METABOLIC PANEL
Lab: 139 MMOL/L — ABNORMAL LOW (ref >60)
Lab: 3.8 MMOL/L (ref >60)

## 2018-02-12 LAB — FOLATE, SERUM: Lab: >22 ng/mL — AB (ref >3.9)

## 2018-02-12 LAB — VITAMIN B12: Lab: 548 pg/mL — AB (ref 180–914)

## 2018-02-12 LAB — POC GLUCOSE
Lab: 389 mg/dL — ABNORMAL HIGH (ref 70–100)
Lab: 349 mg/dL — ABNORMAL HIGH (ref >60)
Lab: 237 mg/dL — ABNORMAL HIGH (ref 70–100)
Lab: 228 mg/dL — ABNORMAL HIGH (ref 70–100)
Lab: 248 mg/dL — ABNORMAL HIGH (ref 70–100)
Lab: 193 mg/dL — ABNORMAL HIGH (ref 70–100)

## 2018-02-12 LAB — IMMATURE PLATELET FRACTION: Lab: 4.7 % (ref 1.1–7.1)

## 2018-02-12 MED ORDER — ACETAMINOPHEN 325 MG PO TAB
650 mg | ORAL | 0 refills | Status: DC | PRN
Start: 2018-02-12 — End: 2018-02-16
  Administered 2018-02-13 – 2018-02-15 (×3): 650 mg via ORAL

## 2018-02-12 MED ORDER — HYDRALAZINE 10 MG PO TAB
25 mg | Freq: Three times a day (TID) | ORAL | 0 refills | Status: DC
Start: 2018-02-12 — End: 2018-02-13
  Administered 2018-02-12 – 2018-02-13 (×3): 25 mg via ORAL

## 2018-02-12 MED ORDER — INSULIN ASPART 100 UNIT/ML SC FLEXPEN
0-6 [IU] | Freq: Before meals | SUBCUTANEOUS | 0 refills | Status: DC
Start: 2018-02-12 — End: 2018-02-12

## 2018-02-12 MED ORDER — ACETAMINOPHEN 650 MG RE SUPP
650 mg | RECTAL | 0 refills | Status: DC | PRN
Start: 2018-02-12 — End: 2018-02-12

## 2018-02-12 MED ORDER — AMLODIPINE 10 MG PO TAB
10 mg | Freq: Every day | ORAL | 0 refills | Status: DC
Start: 2018-02-12 — End: 2018-02-16
  Administered 2018-02-12 – 2018-02-15 (×3): 10 mg via ORAL

## 2018-02-12 MED ORDER — INSULIN ASPART 100 UNIT/ML SC FLEXPEN
0-12 [IU] | Freq: Before meals | SUBCUTANEOUS | 0 refills | Status: DC
Start: 2018-02-12 — End: 2018-02-16

## 2018-02-12 MED ORDER — INSULIN ASPART 100 UNIT/ML SC FLEXPEN
0-6 [IU] | Freq: Before meals | SUBCUTANEOUS | 0 refills | Status: DC
Start: 2018-02-12 — End: 2018-02-12
  Administered 2018-02-12: 08:00:00 3 [IU] via SUBCUTANEOUS

## 2018-02-12 MED ORDER — MELATONIN 1 MG PO TAB
.5 mg | Freq: Every evening | ORAL | 0 refills | Status: DC
Start: 2018-02-12 — End: 2018-02-16
  Administered 2018-02-13 – 2018-02-15 (×3): 0.5 mg via ORAL

## 2018-02-12 MED ORDER — INSULIN GLARGINE 100 UNIT/ML (3 ML) SC INJ PEN
13 [IU] | Freq: Every evening | SUBCUTANEOUS | 0 refills | Status: DC
Start: 2018-02-12 — End: 2018-02-16

## 2018-02-13 LAB — CBC
Lab: 15 % — ABNORMAL HIGH (ref 11–15)
Lab: 2.9 M/UL — ABNORMAL LOW (ref 4.4–5.5)
Lab: 26 % — ABNORMAL LOW (ref 40–50)
Lab: 3.3 10*3/uL — ABNORMAL LOW (ref >60)
Lab: 3.6 10*3/uL — ABNORMAL LOW (ref 4.5–11.0)
Lab: 30 pg (ref 26–34)
Lab: 33 g/dL (ref 32.0–36.0)
Lab: 75 10*3/uL — ABNORMAL LOW (ref 150–400)
Lab: 89 FL (ref 80–100)
Lab: 9 g/dL — ABNORMAL LOW (ref 13.5–16.5)
Lab: 9.5 FL (ref 7–11)

## 2018-02-13 LAB — COMPREHENSIVE METABOLIC PANEL
Lab: 0.4 mg/dL (ref 0.3–1.2)
Lab: 10 U/L (ref 7–40)
Lab: 141 MMOL/L (ref 137–147)
Lab: 181 mg/dL — ABNORMAL HIGH (ref 70–100)
Lab: 2.9 g/dL — ABNORMAL LOW (ref 3.5–5.0)
Lab: 28 MMOL/L (ref 21–30)
Lab: 5.5 g/dL — ABNORMAL LOW (ref 6.0–8.0)
Lab: 56 mg/dL — ABNORMAL HIGH (ref 7–25)
Lab: 8.1 mg/dL — ABNORMAL LOW (ref 8.5–10.6)
Lab: 95 U/L (ref 25–110)

## 2018-02-13 LAB — BASIC METABOLIC PANEL: Lab: 144 MMOL/L — ABNORMAL LOW (ref >60)

## 2018-02-13 LAB — POC GLUCOSE
Lab: 349 mg/dL — ABNORMAL HIGH (ref 70–100)
Lab: 231 mg/dL — ABNORMAL HIGH (ref 70–100)
Lab: 188 mg/dL — ABNORMAL HIGH (ref 70–100)
Lab: 137 mg/dL — ABNORMAL HIGH (ref >60)

## 2018-02-13 LAB — BLOOD GASES, ARTERIAL
Lab: 29 MMOL/L — ABNORMAL HIGH (ref 21–28)
Lab: 48 mmHg — ABNORMAL HIGH (ref 35–45)
Lab: 5.6 MMOL/L
Lab: 57 mmHg — ABNORMAL LOW (ref 80–100)
Lab: 7.4 (ref 7.35–7.45)
Lab: 90 % — ABNORMAL LOW (ref 95–99)

## 2018-02-13 LAB — PROCALCITONIN: Lab: 0 ng/mL (ref <0.11)

## 2018-02-13 LAB — MAGNESIUM
Lab: 1.9 mg/dL (ref 1.6–2.6)
Lab: 2 mg/dL — ABNORMAL LOW (ref 1.6–2.6)

## 2018-02-13 LAB — ALBUMIN: Lab: 2.7 g/dL — ABNORMAL LOW (ref 3.5–5.0)

## 2018-02-13 LAB — PHOSPHORUS: Lab: 3.7 mg/dL (ref 2.0–4.5)

## 2018-02-13 MED ORDER — OXYCODONE 5 MG PO TAB
5-10 mg | ORAL | 0 refills | Status: DC | PRN
Start: 2018-02-13 — End: 2018-02-16
  Administered 2018-02-13: 22:00:00 5 mg via ORAL
  Administered 2018-02-13: 08:00:00 10 mg via ORAL
  Administered 2018-02-15: 02:00:00 5 mg via ORAL

## 2018-02-13 MED ORDER — HYDRALAZINE 10 MG PO TAB
50 mg | Freq: Three times a day (TID) | ORAL | 0 refills | Status: DC
Start: 2018-02-13 — End: 2018-02-14
  Administered 2018-02-13 – 2018-02-14 (×3): 50 mg via ORAL

## 2018-02-14 LAB — MANUAL DIFF
Lab: 1 % — ABNORMAL LOW (ref 4–12)
Lab: 13 % — ABNORMAL LOW (ref 24–44)
Lab: 2 % (ref 0–10)
Lab: 3 % (ref 0–5)
Lab: 81 % — ABNORMAL HIGH (ref 41–77)

## 2018-02-14 LAB — RETICULOCYTE COUNT
Lab: 1.1 % (ref 0–0.20)
Lab: 2.2 % — ABNORMAL HIGH (ref 0.5–2.0)
Lab: 58 10*3/uL (ref 30–94)

## 2018-02-14 LAB — MAGNESIUM: Lab: 1.9 mg/dL — ABNORMAL LOW (ref >60)

## 2018-02-14 LAB — POC GLUCOSE
Lab: 241 mg/dL — ABNORMAL HIGH (ref 70–100)
Lab: 187 mg/dL — ABNORMAL HIGH (ref 70–100)
Lab: 260 mg/dL — ABNORMAL HIGH (ref 70–100)
Lab: 248 mg/dL — ABNORMAL HIGH (ref 70–100)
Lab: 274 mg/dL — ABNORMAL HIGH (ref 70–100)

## 2018-02-14 LAB — ELECTROPHORESIS-SERUM PROTEIN: Lab: 5 g/dL — ABNORMAL LOW (ref >60)

## 2018-02-14 LAB — TOTAL PROTEIN-URINE 24 HR
Lab: 177 mg/dL
Lab: 357 mg/(24.h) — ABNORMAL HIGH (ref 50–150)

## 2018-02-14 LAB — BASIC METABOLIC PANEL: Lab: 138 MMOL/L — ABNORMAL LOW (ref 137–147)

## 2018-02-14 LAB — CBC: Lab: 3.5 K/UL — ABNORMAL LOW (ref 4.5–11.0)

## 2018-02-14 LAB — ALBUMIN: Lab: 2.8 g/dL — ABNORMAL LOW (ref >60)

## 2018-02-14 LAB — FIBRINOGEN: Lab: 284 mg/dL (ref 200–400)

## 2018-02-14 LAB — IRON + BINDING CAPACITY + %SAT+ FERRITIN
Lab: 15 % — ABNORMAL LOW (ref 28–42)
Lab: 246 ug/dL — ABNORMAL LOW (ref 270–380)
Lab: 358 ng/mL — ABNORMAL HIGH (ref 30–300)
Lab: 37 ug/dL — ABNORMAL LOW (ref 50–185)

## 2018-02-14 LAB — PTT (APTT): Lab: 28 s (ref 24.0–36.5)

## 2018-02-14 LAB — URINE COLLECTION
Lab: 201 mL
Lab: 24

## 2018-02-14 LAB — PROTIME INR (PT): Lab: 1 K/UL — ABNORMAL LOW (ref 0.8–1.2)

## 2018-02-14 LAB — LDH-LACTATE DEHYDROGENASE: Lab: 163 U/L (ref 100–210)

## 2018-02-14 LAB — HAPTOGLOBIN: Lab: 235 mg/dL — ABNORMAL HIGH (ref 16–200)

## 2018-02-14 MED ORDER — HYDRALAZINE 100 MG PO TAB
100 mg | Freq: Three times a day (TID) | ORAL | 0 refills | Status: DC
Start: 2018-02-14 — End: 2018-02-15
  Administered 2018-02-14 – 2018-02-15 (×2): 100 mg via ORAL

## 2018-02-14 MED ORDER — FUROSEMIDE 20 MG PO TAB
40 mg | Freq: Two times a day (BID) | ORAL | 0 refills | Status: DC
Start: 2018-02-14 — End: 2018-02-14

## 2018-02-14 MED ORDER — BUMETANIDE 1 MG PO TAB
1 mg | Freq: Two times a day (BID) | ORAL | 0 refills | Status: DC
Start: 2018-02-14 — End: 2018-02-16
  Administered 2018-02-15 (×3): 1 mg via ORAL

## 2018-02-14 MED ORDER — BUMETANIDE 1 MG PO TAB
2 mg | Freq: Two times a day (BID) | ORAL | 0 refills | Status: DC
Start: 2018-02-14 — End: 2018-02-14

## 2018-02-15 ENCOUNTER — Inpatient Hospital Stay: Admit: 2018-02-12 | Discharge: 2018-02-12 | Payer: MEDICARE

## 2018-02-15 ENCOUNTER — Encounter: Admit: 2018-02-15 | Discharge: 2018-02-15 | Payer: MEDICARE

## 2018-02-15 ENCOUNTER — Inpatient Hospital Stay
Admit: 2018-02-11 | Discharge: 2018-02-15 | Disposition: A | Discharge status: Home Health | Payer: MEDICARE | Source: Other Acute Inpatient Hospital | Admitting: Student in an Organized Health Care Education/Training Program

## 2018-02-15 ENCOUNTER — Ambulatory Visit: Admit: 2018-02-07 | Discharge: 2018-02-07 | Payer: MEDICARE

## 2018-02-15 ENCOUNTER — Inpatient Hospital Stay: Admit: 2018-02-10 | Discharge: 2018-02-10 | Payer: MEDICARE

## 2018-02-15 DIAGNOSIS — Z955 Presence of coronary angioplasty implant and graft: ICD-10-CM

## 2018-02-15 DIAGNOSIS — J9601 Acute respiratory failure with hypoxia: ICD-10-CM

## 2018-02-15 DIAGNOSIS — E785 Hyperlipidemia, unspecified: ICD-10-CM

## 2018-02-15 DIAGNOSIS — N179 Acute kidney failure, unspecified: ICD-10-CM

## 2018-02-15 DIAGNOSIS — Z89512 Acquired absence of left leg below knee: ICD-10-CM

## 2018-02-15 DIAGNOSIS — I2510 Atherosclerotic heart disease of native coronary artery without angina pectoris: ICD-10-CM

## 2018-02-15 DIAGNOSIS — Z87891 Personal history of nicotine dependence: ICD-10-CM

## 2018-02-15 DIAGNOSIS — E1151 Type 2 diabetes mellitus with diabetic peripheral angiopathy without gangrene: ICD-10-CM

## 2018-02-15 DIAGNOSIS — N401 Enlarged prostate with lower urinary tract symptoms: ICD-10-CM

## 2018-02-15 DIAGNOSIS — R400 Somnolence: ICD-10-CM

## 2018-02-15 DIAGNOSIS — I864 Gastric varices: ICD-10-CM

## 2018-02-15 DIAGNOSIS — N184 Chronic kidney disease, stage 4 (severe): ICD-10-CM

## 2018-02-15 DIAGNOSIS — R600 Localized edema: ICD-10-CM

## 2018-02-15 DIAGNOSIS — I5033 Acute on chronic diastolic (congestive) heart failure: ICD-10-CM

## 2018-02-15 DIAGNOSIS — Z9049 Acquired absence of other specified parts of digestive tract: ICD-10-CM

## 2018-02-15 DIAGNOSIS — D631 Anemia in chronic kidney disease: ICD-10-CM

## 2018-02-15 DIAGNOSIS — I8510 Secondary esophageal varices without bleeding: ICD-10-CM

## 2018-02-15 DIAGNOSIS — G546 Phantom limb syndrome with pain: ICD-10-CM

## 2018-02-15 DIAGNOSIS — I130 Hypertensive heart and chronic kidney disease with heart failure and stage 1 through stage 4 chronic kidney disease, or unspecified chronic kidney disease: Principal | ICD-10-CM

## 2018-02-15 DIAGNOSIS — K760 Fatty (change of) liver, not elsewhere classified: ICD-10-CM

## 2018-02-15 DIAGNOSIS — R162 Hepatomegaly with splenomegaly, not elsewhere classified: ICD-10-CM

## 2018-02-15 DIAGNOSIS — I428 Other cardiomyopathies: ICD-10-CM

## 2018-02-15 DIAGNOSIS — E1122 Type 2 diabetes mellitus with diabetic chronic kidney disease: ICD-10-CM

## 2018-02-15 DIAGNOSIS — R443 Hallucinations, unspecified: ICD-10-CM

## 2018-02-15 DIAGNOSIS — K861 Other chronic pancreatitis: ICD-10-CM

## 2018-02-15 DIAGNOSIS — K219 Gastro-esophageal reflux disease without esophagitis: ICD-10-CM

## 2018-02-15 DIAGNOSIS — R338 Other retention of urine: ICD-10-CM

## 2018-02-15 DIAGNOSIS — K766 Portal hypertension: ICD-10-CM

## 2018-02-15 DIAGNOSIS — D61818 Other pancytopenia: ICD-10-CM

## 2018-02-15 DIAGNOSIS — R809 Proteinuria, unspecified: ICD-10-CM

## 2018-02-15 DIAGNOSIS — K838 Other specified diseases of biliary tract: ICD-10-CM

## 2018-02-15 DIAGNOSIS — D638 Anemia in other chronic diseases classified elsewhere: ICD-10-CM

## 2018-02-15 DIAGNOSIS — R410 Disorientation, unspecified: ICD-10-CM

## 2018-02-15 DIAGNOSIS — Z794 Long term (current) use of insulin: ICD-10-CM

## 2018-02-15 DIAGNOSIS — K769 Liver disease, unspecified: ICD-10-CM

## 2018-02-15 LAB — PERIPHERAL SMEAR

## 2018-02-15 LAB — POC GLUCOSE
Lab: 274 mg/dL — ABNORMAL HIGH (ref 70–100)
Lab: 196 mg/dL — ABNORMAL HIGH (ref 70–100)
Lab: 349 mg/dL — ABNORMAL HIGH (ref 70–100)

## 2018-02-15 LAB — MAGNESIUM: Lab: 1.9 mg/dL — ABNORMAL LOW (ref 1.6–2.6)

## 2018-02-15 LAB — BASIC METABOLIC PANEL: Lab: 137 MMOL/L — ABNORMAL LOW (ref 137–147)

## 2018-02-15 LAB — ALBUMIN: Lab: 2.9 g/dL — ABNORMAL LOW (ref >60)

## 2018-02-15 LAB — SOLUBLE TRANSFERRIN RECEPTOR: Lab: 2.6

## 2018-02-15 LAB — CBC: Lab: 3.9 K/UL — ABNORMAL LOW (ref >60)

## 2018-02-15 MED ORDER — ASPIRIN 81 MG PO CHEW
81 mg | Freq: Every day | ORAL | 0 refills | Status: DC
Start: 2018-02-15 — End: 2018-02-22

## 2018-02-15 MED ORDER — DARBEPOETIN ALFA IN POLYSORBAT 40 MCG/0.4 ML IJ SYRG
40 ug | Freq: Once | SUBCUTANEOUS | 0 refills | Status: CN
Start: 2018-02-15 — End: ?

## 2018-02-15 MED ORDER — HYDRALAZINE 100 MG PO TAB
100 mg | ORAL_TABLET | Freq: Three times a day (TID) | ORAL | 1 refills | Status: SS
Start: 2018-02-15 — End: 2018-02-22

## 2018-02-15 MED ORDER — HYDRALAZINE 100 MG PO TAB
100 mg | Freq: Three times a day (TID) | ORAL | 0 refills | Status: DC
Start: 2018-02-15 — End: 2018-02-16
  Administered 2018-02-15 (×2): 100 mg via ORAL

## 2018-02-15 MED ORDER — HYDRALAZINE 100 MG PO TAB
200 mg | Freq: Three times a day (TID) | ORAL | 0 refills | Status: DC
Start: 2018-02-15 — End: 2018-02-15

## 2018-02-15 MED ORDER — BUMETANIDE 1 MG PO TAB
1 mg | ORAL_TABLET | Freq: Two times a day (BID) | ORAL | 1 refills | Status: SS
Start: 2018-02-15 — End: 2018-04-06

## 2018-02-15 MED ORDER — MAGNESIUM SULFATE IN D5W 1 GRAM/100 ML IV PGBK
1 g | Freq: Once | INTRAVENOUS | 0 refills | Status: CP
Start: 2018-02-15 — End: ?
  Administered 2018-02-15: 14:00:00 1 g via INTRAVENOUS

## 2018-02-15 MED ORDER — EPOETIN ALFA 10,000 UNIT/ML IJ SOLN
10000 [IU] | Freq: Once | SUBCUTANEOUS | 0 refills | Status: CP
Start: 2018-02-15 — End: ?
  Administered 2018-02-15: 18:00:00 10000 [IU] via SUBCUTANEOUS

## 2018-02-15 MED FILL — BUMETANIDE 1 MG PO TAB: 1 mg | ORAL | 30 days supply | Qty: 60 | Fill #1 | Status: CP

## 2018-02-15 MED FILL — HYDRALAZINE 100 MG PO TAB: 100 mg | ORAL | 30 days supply | Qty: 90 | Fill #1 | Status: CP

## 2018-02-17 ENCOUNTER — Encounter: Admit: 2018-02-17 | Discharge: 2018-02-17 | Payer: MEDICARE

## 2018-02-17 DIAGNOSIS — K922 Gastrointestinal hemorrhage, unspecified: ICD-10-CM

## 2018-02-18 ENCOUNTER — Encounter: Admit: 2018-02-18 | Discharge: 2018-02-18 | Payer: MEDICARE

## 2018-02-18 ENCOUNTER — Inpatient Hospital Stay: Admit: 2018-02-18 | Discharge: 2018-02-18 | Payer: MEDICARE

## 2018-02-18 ENCOUNTER — Inpatient Hospital Stay
Admit: 2018-02-18 | Discharge: 2018-02-22 | Disposition: A | Discharge status: Skilled Nursing Facility | Payer: MEDICARE | Source: Other Acute Inpatient Hospital

## 2018-02-18 DIAGNOSIS — N189 Chronic kidney disease, unspecified: ICD-10-CM

## 2018-02-18 DIAGNOSIS — I739 Peripheral vascular disease, unspecified: ICD-10-CM

## 2018-02-18 DIAGNOSIS — E119 Type 2 diabetes mellitus without complications: ICD-10-CM

## 2018-02-18 DIAGNOSIS — R0600 Dyspnea, unspecified: ICD-10-CM

## 2018-02-18 DIAGNOSIS — I10 Essential (primary) hypertension: Principal | ICD-10-CM

## 2018-02-18 DIAGNOSIS — K859 Acute pancreatitis without necrosis or infection, unspecified: ICD-10-CM

## 2018-02-18 DIAGNOSIS — E785 Hyperlipidemia, unspecified: ICD-10-CM

## 2018-02-18 DIAGNOSIS — I2510 Atherosclerotic heart disease of native coronary artery without angina pectoris: ICD-10-CM

## 2018-02-18 DIAGNOSIS — F17200 Nicotine dependence, unspecified, uncomplicated: ICD-10-CM

## 2018-02-18 LAB — COMPREHENSIVE METABOLIC PANEL
Lab: 0.2 mg/dL — ABNORMAL LOW (ref 0.3–1.2)
Lab: 104 mg/dL — ABNORMAL HIGH (ref 7–25)
Lab: 11 10*3/uL — ABNORMAL LOW (ref 3–12)
Lab: 137 MMOL/L — CL (ref 137–147)
Lab: 185 mg/dL — ABNORMAL HIGH (ref 70–100)
Lab: 2.6 g/dL — ABNORMAL LOW (ref 3.5–5.0)
Lab: 2.8 mg/dL — ABNORMAL HIGH (ref 0.4–1.24)
Lab: 22 mL/min — ABNORMAL LOW (ref >60)
Lab: 26 MMOL/L (ref 21–30)
Lab: 27 mL/min — ABNORMAL LOW (ref >60)
Lab: 4.7 g/dL — ABNORMAL LOW (ref 6.0–8.0)
Lab: 6 U/L — ABNORMAL LOW (ref 7–56)
Lab: 7.7 mg/dL — ABNORMAL LOW (ref 8.5–10.6)
Lab: 78 U/L (ref 25–110)
Lab: 9 U/L (ref 7–40)
Lab: 0.2 mg/dL — ABNORMAL LOW (ref 0.3–1.2)
Lab: 105 mg/dL — ABNORMAL HIGH (ref 7–25)
Lab: 137 MMOL/L — ABNORMAL LOW (ref 137–147)
Lab: 180 mg/dL — ABNORMAL HIGH (ref 70–100)
Lab: 2.7 g/dL — ABNORMAL LOW (ref 3.5–5.0)
Lab: 2.8 mg/dL — ABNORMAL HIGH (ref >60)
Lab: 22 mL/min — ABNORMAL LOW (ref >60)
Lab: 27 MMOL/L (ref 21–30)
Lab: 27 mL/min — ABNORMAL LOW (ref >60)
Lab: 4.7 MMOL/L — ABNORMAL LOW (ref 3.5–5.1)
Lab: 4.8 g/dL — ABNORMAL LOW (ref >60)
Lab: 7.8 mg/dL — ABNORMAL LOW (ref >60)
Lab: 77 U/L (ref 25–110)
Lab: 8 U/L (ref 7–40)
Lab: 8 U/L (ref 7–56)
Lab: 9 10*3/uL — ABNORMAL LOW (ref 3–12)

## 2018-02-18 LAB — POC BLOOD GAS ARTERIAL
Lab: 25 MMOL/L (ref 21–28)
Lab: 41 mmHg (ref 35–45)
Lab: 7.4 (ref 7.35–7.45)

## 2018-02-18 LAB — CBC AND DIFF
Lab: 0 10*3/uL (ref 0–0.20)
Lab: 4.7 10*3/uL (ref 4.5–11.0)
Lab: 0 10*3/uL (ref 0–0.20)
Lab: 4.6 K/UL — ABNORMAL LOW (ref 4.5–11.0)

## 2018-02-18 LAB — POC IONIZED CALCIUM: Lab: 1.1 MMOL/L (ref 1.0–1.3)

## 2018-02-18 LAB — PROTIME INR (PT): Lab: 1.1 M/UL — ABNORMAL LOW (ref 0.8–1.2)

## 2018-02-18 LAB — POC GLUCOSE
Lab: 217 mg/dL — ABNORMAL HIGH (ref 70–100)
Lab: 205 mg/dL — ABNORMAL HIGH (ref 70–100)
Lab: 198 mg/dL — ABNORMAL HIGH (ref 70–100)

## 2018-02-18 LAB — POC HEMATOCRIT
Lab: 23 % — ABNORMAL LOW (ref 40–50)
Lab: 7.8 g/dL — ABNORMAL LOW (ref 13.5–16.5)

## 2018-02-18 LAB — POC SODIUM: Lab: 138 MMOL/L (ref 137–147)

## 2018-02-18 LAB — PHOSPHORUS: Lab: 5.1 mg/dL — ABNORMAL HIGH (ref 2.0–4.5)

## 2018-02-18 LAB — MAGNESIUM: Lab: 2.1 mg/dL — ABNORMAL LOW (ref 1.6–2.6)

## 2018-02-18 LAB — POC POTASSIUM: Lab: 4.4 MMOL/L — ABNORMAL LOW (ref 3.5–5.1)

## 2018-02-18 LAB — HEMOGLOBIN: Lab: 6.9 g/dL — ABNORMAL LOW (ref 13.5–16.5)

## 2018-02-18 MED ORDER — PANTOPRAZOLE 40 MG IV SOLR
40 mg | Freq: Two times a day (BID) | INTRAVENOUS | 0 refills | Status: DC
Start: 2018-02-18 — End: 2018-02-18

## 2018-02-18 MED ORDER — PANTOPRAZOLE 40 MG IV SOLR
80 mg | Freq: Once | INTRAVENOUS | 0 refills | Status: CP
Start: 2018-02-18 — End: ?
  Administered 2018-02-18 (×2): 80 mg via INTRAVENOUS

## 2018-02-18 MED ORDER — HYDROCODONE-ACETAMINOPHEN 5-325 MG PO TAB
1-2 | Freq: Once | ORAL | 0 refills | Status: DC | PRN
Start: 2018-02-18 — End: 2018-02-19

## 2018-02-18 MED ORDER — SUCCINYLCHOLINE CHLORIDE 20 MG/ML IJ SOLN
INTRAVENOUS | 0 refills | Status: DC
Start: 2018-02-18 — End: 2018-02-19
  Administered 2018-02-18: 23:00:00 80 mg via INTRAVENOUS

## 2018-02-18 MED ORDER — PROMETHAZINE 25 MG/ML IJ SOLN
6.25 mg | INTRAVENOUS | 0 refills | Status: DC | PRN
Start: 2018-02-18 — End: 2018-02-19

## 2018-02-18 MED ORDER — ACETAMINOPHEN/LIDOCAINE/ANTACID DS(#) 1:1:3  PO SUSP
30 mL | Freq: Once | ORAL | 0 refills | Status: CP
Start: 2018-02-18 — End: ?
  Administered 2018-02-19: 05:00:00 30 mL via ORAL

## 2018-02-18 MED ORDER — LIDOCAINE (PF) 200 MG/10 ML (2 %) IJ SYRG
0 refills | Status: DC
Start: 2018-02-18 — End: 2018-02-19
  Administered 2018-02-18: 23:00:00 80 mg via INTRAVENOUS

## 2018-02-18 MED ORDER — CEFTRIAXONE INJ 1GM IVP
1 g | INTRAVENOUS | 0 refills | Status: DC
Start: 2018-02-18 — End: 2018-02-21
  Administered 2018-02-18 – 2018-02-21 (×4): 1 g via INTRAVENOUS

## 2018-02-18 MED ORDER — MELATONIN 3 MG PO TAB
3 mg | Freq: Every evening | ORAL | 0 refills | Status: DC
Start: 2018-02-18 — End: 2018-02-22
  Administered 2018-02-19 – 2018-02-22 (×4): 3 mg via ORAL

## 2018-02-18 MED ORDER — FENTANYL CITRATE (PF) 50 MCG/ML IJ SOLN
50 ug | INTRAVENOUS | 0 refills | Status: DC | PRN
Start: 2018-02-18 — End: 2018-02-19

## 2018-02-18 MED ORDER — SODIUM CHLORIDE 0.9 % IV SOLP
INTRAVENOUS | 0 refills | Status: CN
Start: 2018-02-18 — End: ?

## 2018-02-18 MED ORDER — LACTATED RINGERS IV SOLP
1000 mL | Freq: Once | INTRAVENOUS | 0 refills | Status: CP
Start: 2018-02-18 — End: ?
  Administered 2018-02-18: 23:00:00 1000.000 mL via INTRAVENOUS

## 2018-02-18 MED ORDER — FENTANYL CITRATE (PF) 50 MCG/ML IJ SOLN
0 refills | Status: DC
Start: 2018-02-18 — End: 2018-02-19
  Administered 2018-02-18: 23:00:00 50 ug via INTRAVENOUS

## 2018-02-18 MED ORDER — METOCLOPRAMIDE HCL 5 MG/ML IJ SOLN
10 mg | Freq: Once | INTRAVENOUS | 0 refills | Status: AC
Start: 2018-02-18 — End: ?

## 2018-02-18 MED ORDER — DEXTRAN 70-HYPROMELLOSE (PF) 0.1-0.3 % OP DPET
0 refills | Status: DC
Start: 2018-02-18 — End: 2018-02-19
  Administered 2018-02-18: 23:00:00 2 [drp] via OPHTHALMIC

## 2018-02-18 MED ORDER — ONDANSETRON HCL (PF) 4 MG/2 ML IJ SOLN
INTRAVENOUS | 0 refills | Status: DC
Start: 2018-02-18 — End: 2018-02-19
  Administered 2018-02-18: 4 mg via INTRAVENOUS

## 2018-02-18 MED ORDER — PROPOFOL INJ 10 MG/ML IV VIAL
0 refills | Status: DC
Start: 2018-02-18 — End: 2018-02-19
  Administered 2018-02-18: 23:00:00 100 mg via INTRAVENOUS

## 2018-02-18 MED ORDER — PHENOL 1.4 % MM SPRA
2 | OROMUCOSAL | 0 refills | Status: DC | PRN
Start: 2018-02-18 — End: 2018-02-22
  Administered 2018-02-19: 15:00:00 2 via OROMUCOSAL

## 2018-02-18 MED ORDER — LACTATED RINGERS IV SOLP
500 mL | Freq: Once | INTRAVENOUS | 0 refills | Status: CP
Start: 2018-02-18 — End: ?
  Administered 2018-02-18: 06:00:00 500 mL via INTRAVENOUS

## 2018-02-18 MED ORDER — DEXAMETHASONE SODIUM PHOSPHATE 4 MG/ML IJ SOLN
INTRAVENOUS | 0 refills | Status: DC
Start: 2018-02-18 — End: 2018-02-19
  Administered 2018-02-18: 23:00:00 4 mg via INTRAVENOUS

## 2018-02-18 MED ORDER — PANTOPRAZOLE IV INFUSION
8 mg/h | INTRAVENOUS | 0 refills | Status: DC
Start: 2018-02-18 — End: 2018-02-21
  Administered 2018-02-18 – 2018-02-21 (×9): 8 mg/h via INTRAVENOUS

## 2018-02-19 LAB — POC GLUCOSE
Lab: 197 mg/dL — ABNORMAL HIGH (ref 70–100)
Lab: 225 mg/dL — ABNORMAL HIGH (ref 70–100)
Lab: 239 mg/dL — ABNORMAL HIGH (ref 70–100)
Lab: 333 mg/dL — ABNORMAL HIGH (ref 70–100)
Lab: 425 mg/dL — ABNORMAL HIGH (ref >60)

## 2018-02-19 LAB — ELECTROPHORESIS-UR 24 HR
Lab: 177 mg/dL
Lab: 4 %
Lab: 5 %
Lab: 6 %
Lab: 77 %
Lab: 8 %

## 2018-02-19 LAB — HEMOGLOBIN
Lab: 8.6 g/dL — ABNORMAL LOW (ref 13.5–16.5)
Lab: 8 g/dL — ABNORMAL LOW (ref 13.5–16.5)

## 2018-02-19 LAB — CBC AND DIFF: Lab: 5 K/UL (ref >60)

## 2018-02-19 LAB — COMPREHENSIVE METABOLIC PANEL: Lab: 138 MMOL/L — ABNORMAL LOW (ref 137–147)

## 2018-02-19 MED ORDER — LACTATED RINGERS IV SOLP
INTRAVENOUS | 0 refills | Status: DC
Start: 2018-02-19 — End: 2018-02-19

## 2018-02-19 MED ORDER — DIPHENHYDRAMINE HCL 50 MG/ML IJ SOLN
25 mg | Freq: Once | INTRAVENOUS | 0 refills | Status: DC | PRN
Start: 2018-02-19 — End: 2018-02-19

## 2018-02-19 MED ORDER — FENTANYL CITRATE (PF) 50 MCG/ML IJ SOLN
25-50 ug | INTRAVENOUS | 0 refills | Status: DC | PRN
Start: 2018-02-19 — End: 2018-02-19

## 2018-02-19 MED ORDER — TRAZODONE 50 MG PO TAB
50 mg | Freq: Every evening | ORAL | 0 refills | Status: DC | PRN
Start: 2018-02-19 — End: 2018-02-22
  Administered 2018-02-19 – 2018-02-20 (×2): 50 mg via ORAL

## 2018-02-19 MED ORDER — AMLODIPINE 10 MG PO TAB
10 mg | Freq: Every day | ORAL | 0 refills | Status: DC
Start: 2018-02-19 — End: 2018-02-22
  Administered 2018-02-19 – 2018-02-22 (×4): 10 mg via ORAL

## 2018-02-19 MED ORDER — CARVEDILOL 12.5 MG PO TAB
25 mg | Freq: Two times a day (BID) | ORAL | 0 refills | Status: DC
Start: 2018-02-19 — End: 2018-02-20
  Administered 2018-02-19 – 2018-02-20 (×3): 25 mg via ORAL

## 2018-02-19 MED ORDER — INSULIN GLARGINE 100 UNIT/ML (3 ML) SC INJ PEN
13 [IU] | Freq: Every evening | SUBCUTANEOUS | 0 refills | Status: DC
Start: 2018-02-19 — End: 2018-02-20
  Administered 2018-02-20: 04:00:00 13 [IU] via SUBCUTANEOUS

## 2018-02-19 MED ORDER — IPRATROPIUM BROMIDE 0.02 % IN SOLN
0.5 mg | RESPIRATORY_TRACT | 0 refills | Status: DC | PRN
Start: 2018-02-19 — End: 2018-02-19
  Administered 2018-02-19: 22:00:00 0.5 mg via RESPIRATORY_TRACT

## 2018-02-19 MED ORDER — OXYCODONE 5 MG PO TAB
5 mg | Freq: Once | ORAL | 0 refills | Status: CP
Start: 2018-02-19 — End: ?
  Administered 2018-02-19: 07:00:00 5 mg via ORAL

## 2018-02-19 MED ORDER — HYDROXYZINE HCL 25 MG PO TAB
25 mg | Freq: Once | ORAL | 0 refills | Status: CP
Start: 2018-02-19 — End: ?
  Administered 2018-02-19: 22:00:00 25 mg via ORAL

## 2018-02-19 MED ORDER — ALBUTEROL SULFATE 2.5 MG/0.5 ML IN NEBU
2.5 mg | RESPIRATORY_TRACT | 0 refills | Status: DC | PRN
Start: 2018-02-19 — End: 2018-02-19
  Administered 2018-02-19: 22:00:00 2.5 mg via RESPIRATORY_TRACT

## 2018-02-19 MED ORDER — IPRATROPIUM BROMIDE 0.02 % IN SOLN
0.5 mg | Freq: Four times a day (QID) | RESPIRATORY_TRACT | 0 refills | Status: DC | PRN
Start: 2018-02-19 — End: 2018-02-22
  Administered 2018-02-20 – 2018-02-22 (×6): 0.5 mg via RESPIRATORY_TRACT

## 2018-02-19 MED ORDER — AMLODIPINE(#) 1MG/ML PO SUSP
10 mg | Freq: Every day | ORAL | 0 refills | Status: DC
Start: 2018-02-19 — End: 2018-02-19

## 2018-02-19 MED ORDER — FUROSEMIDE 10 MG/ML IJ SOLN
40 mg | Freq: Once | INTRAVENOUS | 0 refills | Status: CP
Start: 2018-02-19 — End: ?
  Administered 2018-02-19: 23:00:00 40 mg via INTRAVENOUS

## 2018-02-19 MED ORDER — CITALOPRAM 20 MG PO TAB
40 mg | Freq: Every evening | ORAL | 0 refills | Status: DC
Start: 2018-02-19 — End: 2018-02-22
  Administered 2018-02-19 – 2018-02-22 (×3): 40 mg via ORAL

## 2018-02-19 MED ORDER — INSULIN ASPART 100 UNIT/ML SC FLEXPEN
0-6 [IU] | Freq: Before meals | SUBCUTANEOUS | 0 refills | Status: DC
Start: 2018-02-19 — End: 2018-02-22
  Administered 2018-02-19: 15:00:00 2 [IU] via SUBCUTANEOUS

## 2018-02-19 MED ORDER — ALBUTEROL SULFATE 2.5 MG/0.5 ML IN NEBU
2.5 mg | Freq: Four times a day (QID) | RESPIRATORY_TRACT | 0 refills | Status: DC | PRN
Start: 2018-02-19 — End: 2018-02-22
  Administered 2018-02-20 – 2018-02-22 (×6): 2.5 mg via RESPIRATORY_TRACT

## 2018-02-20 ENCOUNTER — Encounter: Admit: 2018-02-20 | Discharge: 2018-02-20 | Payer: MEDICARE

## 2018-02-20 DIAGNOSIS — K921 Melena: Principal | ICD-10-CM

## 2018-02-20 LAB — COMPREHENSIVE METABOLIC PANEL: Lab: 136 MMOL/L — ABNORMAL LOW (ref 137–147)

## 2018-02-20 LAB — POC GLUCOSE
Lab: 462 mg/dL — ABNORMAL HIGH (ref 70–100)
Lab: 256 mg/dL — ABNORMAL HIGH (ref 70–100)
Lab: 289 mg/dL — ABNORMAL HIGH (ref 70–100)
Lab: 285 mg/dL — ABNORMAL HIGH (ref 70–100)

## 2018-02-20 LAB — HEMOGLOBIN
Lab: 8.5 g/dL — ABNORMAL LOW (ref 13.5–16.5)
Lab: 8.1 g/dL — ABNORMAL LOW (ref 13.5–16.5)

## 2018-02-20 LAB — CBC AND DIFF: Lab: 5 K/UL — ABNORMAL LOW (ref >60)

## 2018-02-20 MED ORDER — BISACODYL 5 MG PO TBEC
10 mg | Freq: Once | ORAL | 0 refills | Status: AC | PRN
Start: 2018-02-20 — End: ?

## 2018-02-20 MED ORDER — SIMETHICONE 80 MG PO CHEW
80 mg | Freq: Once | ORAL | 0 refills | Status: AC | PRN
Start: 2018-02-20 — End: ?

## 2018-02-20 MED ORDER — BUMETANIDE 1 MG PO TAB
1 mg | Freq: Two times a day (BID) | ORAL | 0 refills | Status: DC
Start: 2018-02-20 — End: 2018-02-20
  Administered 2018-02-20: 18:00:00 1 mg via ORAL

## 2018-02-20 MED ORDER — POTASSIUM CHLORIDE 10 MEQ PO TBTQ
10 meq | Freq: Two times a day (BID) | ORAL | 0 refills | Status: DC
Start: 2018-02-20 — End: 2018-02-22
  Administered 2018-02-20 – 2018-02-22 (×3): 10 meq via ORAL

## 2018-02-20 MED ORDER — PEG-ELECTROLYTE SOLN 420 GRAM PO SOLR
2 L | ORAL | 0 refills | Status: DC | PRN
Start: 2018-02-20 — End: 2018-02-22

## 2018-02-20 MED ORDER — SODIUM CHLORIDE 0.65 % NA SPRA
1-2 | NASAL | 0 refills | Status: DC | PRN
Start: 2018-02-20 — End: 2018-02-22

## 2018-02-20 MED ORDER — FUROSEMIDE 10 MG/ML IJ SOLN
40 mg | Freq: Two times a day (BID) | INTRAVENOUS | 0 refills | Status: DC
Start: 2018-02-20 — End: 2018-02-22
  Administered 2018-02-20 – 2018-02-22 (×3): 40 mg via INTRAVENOUS

## 2018-02-20 MED ORDER — ROPINIROLE 1 MG PO TAB
4 mg | Freq: Every evening | ORAL | 0 refills | Status: DC | PRN
Start: 2018-02-20 — End: 2018-02-22
  Administered 2018-02-20 – 2018-02-22 (×3): 4 mg via ORAL

## 2018-02-20 MED ORDER — PEG-ELECTROLYTE SOLN 420 GRAM PO SOLR
4 L | ORAL | 0 refills | Status: DC
Start: 2018-02-20 — End: 2018-02-22
  Administered 2018-02-20: 23:00:00 4 L via ORAL

## 2018-02-20 MED ORDER — PROPRANOLOL 10 MG PO TAB
40 mg | Freq: Three times a day (TID) | ORAL | 0 refills | Status: DC
Start: 2018-02-20 — End: 2018-02-22
  Administered 2018-02-21 – 2018-02-22 (×3): 40 mg via ORAL

## 2018-02-20 MED ORDER — INSULIN GLARGINE 100 UNIT/ML (3 ML) SC INJ PEN
20 [IU] | Freq: Every evening | SUBCUTANEOUS | 0 refills | Status: DC
Start: 2018-02-20 — End: 2018-02-22
  Administered 2018-02-21: 02:00:00 20 [IU] via SUBCUTANEOUS

## 2018-02-21 ENCOUNTER — Encounter: Admit: 2018-02-21 | Discharge: 2018-02-21 | Payer: MEDICARE

## 2018-02-21 ENCOUNTER — Inpatient Hospital Stay: Admit: 2018-02-21 | Discharge: 2018-02-21 | Payer: MEDICARE

## 2018-02-21 DIAGNOSIS — E785 Hyperlipidemia, unspecified: ICD-10-CM

## 2018-02-21 DIAGNOSIS — I2510 Atherosclerotic heart disease of native coronary artery without angina pectoris: ICD-10-CM

## 2018-02-21 DIAGNOSIS — N189 Chronic kidney disease, unspecified: ICD-10-CM

## 2018-02-21 DIAGNOSIS — R0600 Dyspnea, unspecified: ICD-10-CM

## 2018-02-21 DIAGNOSIS — I739 Peripheral vascular disease, unspecified: ICD-10-CM

## 2018-02-21 DIAGNOSIS — E119 Type 2 diabetes mellitus without complications: ICD-10-CM

## 2018-02-21 DIAGNOSIS — F17200 Nicotine dependence, unspecified, uncomplicated: ICD-10-CM

## 2018-02-21 DIAGNOSIS — I10 Essential (primary) hypertension: Principal | ICD-10-CM

## 2018-02-21 DIAGNOSIS — K859 Acute pancreatitis without necrosis or infection, unspecified: ICD-10-CM

## 2018-02-21 LAB — POC GLUCOSE
Lab: 229 mg/dL — ABNORMAL HIGH (ref 70–100)
Lab: 73 mg/dL (ref 70–100)
Lab: 75 mg/dL (ref 70–100)
Lab: 79 mg/dL (ref 70–100)
Lab: 132 mg/dL — ABNORMAL HIGH (ref 70–100)
Lab: 298 mg/dL — ABNORMAL HIGH (ref 70–100)

## 2018-02-21 LAB — HEMOGLOBIN
Lab: 9.1 g/dL — ABNORMAL LOW (ref 13.5–16.5)
Lab: 9.7 g/dL — ABNORMAL LOW (ref 13.5–16.5)

## 2018-02-21 LAB — CBC AND DIFF: Lab: 5.9 K/UL — ABNORMAL LOW (ref 4.5–11.0)

## 2018-02-21 LAB — COMPREHENSIVE METABOLIC PANEL: Lab: 139 MMOL/L — ABNORMAL LOW (ref 137–147)

## 2018-02-21 MED ORDER — LIDOCAINE (PF) 200 MG/10 ML (2 %) IJ SYRG
0 refills | Status: DC
Start: 2018-02-21 — End: 2018-02-21
  Administered 2018-02-21: 16:00:00 120 mg via INTRAVENOUS

## 2018-02-21 MED ORDER — SODIUM CHLORIDE 0.9 % IV SOLP
1000 mL | Freq: Once | INTRAVENOUS | 0 refills | Status: CP
Start: 2018-02-21 — End: ?
  Administered 2018-02-21: 14:00:00 1000 mL via INTRAVENOUS

## 2018-02-21 MED ORDER — PROMETHAZINE 25 MG/ML IJ SOLN
6.25 mg | INTRAVENOUS | 0 refills | Status: DC | PRN
Start: 2018-02-21 — End: 2018-02-21

## 2018-02-21 MED ORDER — PANTOPRAZOLE 40 MG IV SOLR
40 mg | Freq: Two times a day (BID) | INTRAVENOUS | 0 refills | Status: DC
Start: 2018-02-21 — End: 2018-02-22
  Administered 2018-02-22: 01:00:00 40 mg via INTRAVENOUS

## 2018-02-21 MED ORDER — HALOPERIDOL LACTATE 5 MG/ML IJ SOLN
1 mg | Freq: Once | INTRAVENOUS | 0 refills | Status: DC | PRN
Start: 2018-02-21 — End: 2018-02-21

## 2018-02-21 MED ORDER — PROPOFOL 10 MG/ML IV EMUL 50 ML (INFUSION)(AM)(OR)
INTRAVENOUS | 0 refills | Status: DC
Start: 2018-02-21 — End: 2018-02-21
  Administered 2018-02-21: 16:00:00 120 ug/kg/min via INTRAVENOUS

## 2018-02-21 MED ORDER — OXYCODONE 5 MG/5 ML PO SOLN
10 mg | Freq: Once | ORAL | 0 refills | Status: DC | PRN
Start: 2018-02-21 — End: 2018-02-21

## 2018-02-21 MED ORDER — FENTANYL CITRATE (PF) 50 MCG/ML IJ SOLN
25 ug | INTRAVENOUS | 0 refills | Status: DC | PRN
Start: 2018-02-21 — End: 2018-02-21

## 2018-02-21 MED ORDER — ONDANSETRON HCL (PF) 4 MG/2 ML IJ SOLN
4 mg | Freq: Once | INTRAVENOUS | 0 refills | Status: DC | PRN
Start: 2018-02-21 — End: 2018-02-21

## 2018-02-21 MED ORDER — SODIUM CHLORIDE 0.9 % IV SOLP
INTRAVENOUS | 0 refills | Status: DC
Start: 2018-02-21 — End: 2018-02-22
  Administered 2018-02-21: 16:00:00 1000.000 mL via INTRAVENOUS

## 2018-02-21 MED ORDER — KETAMINE 10 MG/ML IJ SOLN
0 refills | Status: DC
Start: 2018-02-21 — End: 2018-02-21
  Administered 2018-02-21 (×3): 10 mg via INTRAVENOUS

## 2018-02-21 MED ORDER — PROPOFOL INJ 10 MG/ML IV VIAL
0 refills | Status: DC
Start: 2018-02-21 — End: 2018-02-21
  Administered 2018-02-21 (×3): 20 mg via INTRAVENOUS

## 2018-02-21 MED ORDER — ONDANSETRON HCL (PF) 4 MG/2 ML IJ SOLN
4 mg | Freq: Once | INTRAVENOUS | 0 refills | Status: AC
Start: 2018-02-21 — End: ?

## 2018-02-22 ENCOUNTER — Encounter: Admit: 2018-02-22 | Discharge: 2018-02-22 | Payer: MEDICARE

## 2018-02-22 ENCOUNTER — Inpatient Hospital Stay: Admit: 2018-02-21 | Discharge: 2018-02-21 | Payer: MEDICARE

## 2018-02-22 ENCOUNTER — Inpatient Hospital Stay: Admit: 2018-02-20 | Discharge: 2018-02-20 | Payer: MEDICARE

## 2018-02-22 ENCOUNTER — Inpatient Hospital Stay: Admit: 2018-02-18 | Discharge: 2018-02-18 | Payer: MEDICARE

## 2018-02-22 DIAGNOSIS — E1122 Type 2 diabetes mellitus with diabetic chronic kidney disease: ICD-10-CM

## 2018-02-22 DIAGNOSIS — I864 Gastric varices: ICD-10-CM

## 2018-02-22 DIAGNOSIS — N179 Acute kidney failure, unspecified: ICD-10-CM

## 2018-02-22 DIAGNOSIS — D125 Benign neoplasm of sigmoid colon: ICD-10-CM

## 2018-02-22 DIAGNOSIS — I8500 Esophageal varices without bleeding: ICD-10-CM

## 2018-02-22 DIAGNOSIS — G9341 Metabolic encephalopathy: ICD-10-CM

## 2018-02-22 DIAGNOSIS — K3189 Other diseases of stomach and duodenum: ICD-10-CM

## 2018-02-22 DIAGNOSIS — K859 Acute pancreatitis without necrosis or infection, unspecified: ICD-10-CM

## 2018-02-22 DIAGNOSIS — E1151 Type 2 diabetes mellitus with diabetic peripheral angiopathy without gangrene: ICD-10-CM

## 2018-02-22 DIAGNOSIS — I5032 Chronic diastolic (congestive) heart failure: ICD-10-CM

## 2018-02-22 DIAGNOSIS — J449 Chronic obstructive pulmonary disease, unspecified: ICD-10-CM

## 2018-02-22 DIAGNOSIS — N189 Chronic kidney disease, unspecified: ICD-10-CM

## 2018-02-22 DIAGNOSIS — Z7982 Long term (current) use of aspirin: ICD-10-CM

## 2018-02-22 DIAGNOSIS — K921 Melena: Principal | ICD-10-CM

## 2018-02-22 DIAGNOSIS — K861 Other chronic pancreatitis: ICD-10-CM

## 2018-02-22 DIAGNOSIS — E119 Type 2 diabetes mellitus without complications: ICD-10-CM

## 2018-02-22 DIAGNOSIS — I2510 Atherosclerotic heart disease of native coronary artery without angina pectoris: ICD-10-CM

## 2018-02-22 DIAGNOSIS — E785 Hyperlipidemia, unspecified: ICD-10-CM

## 2018-02-22 DIAGNOSIS — K635 Polyp of colon: ICD-10-CM

## 2018-02-22 DIAGNOSIS — I130 Hypertensive heart and chronic kidney disease with heart failure and stage 1 through stage 4 chronic kidney disease, or unspecified chronic kidney disease: ICD-10-CM

## 2018-02-22 DIAGNOSIS — Z79899 Other long term (current) drug therapy: ICD-10-CM

## 2018-02-22 DIAGNOSIS — K8681 Exocrine pancreatic insufficiency: ICD-10-CM

## 2018-02-22 DIAGNOSIS — Z89612 Acquired absence of left leg above knee: ICD-10-CM

## 2018-02-22 DIAGNOSIS — Z89512 Acquired absence of left leg below knee: ICD-10-CM

## 2018-02-22 DIAGNOSIS — R0600 Dyspnea, unspecified: ICD-10-CM

## 2018-02-22 DIAGNOSIS — F17200 Nicotine dependence, unspecified, uncomplicated: ICD-10-CM

## 2018-02-22 DIAGNOSIS — N184 Chronic kidney disease, stage 4 (severe): ICD-10-CM

## 2018-02-22 DIAGNOSIS — I10 Essential (primary) hypertension: Principal | ICD-10-CM

## 2018-02-22 DIAGNOSIS — I739 Peripheral vascular disease, unspecified: ICD-10-CM

## 2018-02-22 LAB — POC GLUCOSE
Lab: 385 mg/dL — ABNORMAL HIGH (ref 70–100)
Lab: 129 mg/dL — ABNORMAL HIGH (ref 70–100)
Lab: 271 mg/dL — ABNORMAL HIGH (ref 70–100)

## 2018-02-22 LAB — COMPREHENSIVE METABOLIC PANEL: Lab: 141 MMOL/L — ABNORMAL LOW (ref 137–147)

## 2018-02-22 LAB — CBC AND DIFF: Lab: 5.7 K/UL — ABNORMAL LOW (ref >60)

## 2018-02-22 LAB — HEMOGLOBIN: Lab: 8 g/dL — ABNORMAL LOW (ref 13.5–16.5)

## 2018-02-22 MED ORDER — PANTOPRAZOLE 40 MG PO TBEC
40 mg | Freq: Every day | ORAL | 0 refills | Status: DC
Start: 2018-02-22 — End: 2018-02-22

## 2018-02-22 MED ORDER — BUMETANIDE 1 MG PO TAB
1 mg | Freq: Two times a day (BID) | ORAL | 0 refills | Status: DC
Start: 2018-02-22 — End: 2018-02-22

## 2018-02-22 MED ORDER — FERROUS SULFATE 325 MG (65 MG IRON) PO TAB
650 mg | Freq: Every day | ORAL | 0 refills | Status: AC
Start: 2018-02-22 — End: ?

## 2018-02-22 MED ORDER — INSULIN GLARGINE 100 UNIT/ML (3 ML) SC INJ PEN
20 [IU] | Freq: Every evening | SUBCUTANEOUS | 0 refills | Status: SS
Start: 2018-02-22 — End: 2018-04-06

## 2018-02-22 MED ORDER — HYDRALAZINE 25 MG PO TAB
50 mg | Freq: Three times a day (TID) | ORAL | 0 refills | 30.00000 days | Status: AC
Start: 2018-02-22 — End: 2018-03-05

## 2018-02-22 MED ORDER — INSULIN ASPART 100 UNIT/ML SC FLEXPEN
0-8 [IU] | Freq: Three times a day (TID) | SUBCUTANEOUS | 0 refills | Status: SS
Start: 2018-02-22 — End: 2018-04-06

## 2018-02-22 MED ORDER — SODIUM CHLORIDE 0.9 % IV SOLP
INTRAVENOUS | 0 refills | Status: CN
Start: 2018-02-22 — End: ?

## 2018-02-22 MED ORDER — MELATONIN 3 MG PO TAB
3 mg | Freq: Every evening | ORAL | 0 refills | 28.00000 days | Status: AC
Start: 2018-02-22 — End: ?

## 2018-02-22 MED ORDER — HYDRALAZINE 10 MG PO TAB
20 mg | Freq: Three times a day (TID) | ORAL | 0 refills | Status: DC
Start: 2018-02-22 — End: 2018-02-22
  Administered 2018-02-22 (×2): 20 mg via ORAL

## 2018-02-22 MED ORDER — PROPRANOLOL 10 MG PO TAB
60 mg | Freq: Three times a day (TID) | ORAL | 0 refills | Status: DC
Start: 2018-02-22 — End: 2018-02-22
  Administered 2018-02-22 (×2): 60 mg via ORAL

## 2018-02-22 MED ORDER — PROPRANOLOL 60 MG PO TAB
60 mg | Freq: Three times a day (TID) | ORAL | 0 refills | Status: SS
Start: 2018-02-22 — End: 2018-04-06

## 2018-02-24 ENCOUNTER — Encounter: Admit: 2018-02-24 | Discharge: 2018-02-24 | Payer: MEDICARE

## 2018-02-24 DIAGNOSIS — K859 Acute pancreatitis without necrosis or infection, unspecified: ICD-10-CM

## 2018-02-24 DIAGNOSIS — F17200 Nicotine dependence, unspecified, uncomplicated: ICD-10-CM

## 2018-02-24 DIAGNOSIS — E785 Hyperlipidemia, unspecified: ICD-10-CM

## 2018-02-24 DIAGNOSIS — E119 Type 2 diabetes mellitus without complications: ICD-10-CM

## 2018-02-24 DIAGNOSIS — I739 Peripheral vascular disease, unspecified: ICD-10-CM

## 2018-02-24 DIAGNOSIS — R0600 Dyspnea, unspecified: ICD-10-CM

## 2018-02-24 DIAGNOSIS — N189 Chronic kidney disease, unspecified: ICD-10-CM

## 2018-02-24 DIAGNOSIS — I10 Essential (primary) hypertension: Principal | ICD-10-CM

## 2018-02-24 DIAGNOSIS — I2510 Atherosclerotic heart disease of native coronary artery without angina pectoris: ICD-10-CM

## 2018-02-25 ENCOUNTER — Encounter: Admit: 2018-02-25 | Discharge: 2018-02-25 | Payer: MEDICARE

## 2018-02-25 ENCOUNTER — Ambulatory Visit: Admit: 2018-02-25 | Discharge: 2018-02-25 | Payer: MEDICARE

## 2018-02-25 DIAGNOSIS — E119 Type 2 diabetes mellitus without complications: ICD-10-CM

## 2018-02-25 DIAGNOSIS — N189 Chronic kidney disease, unspecified: ICD-10-CM

## 2018-02-25 DIAGNOSIS — I739 Peripheral vascular disease, unspecified: ICD-10-CM

## 2018-02-25 DIAGNOSIS — F17200 Nicotine dependence, unspecified, uncomplicated: ICD-10-CM

## 2018-02-25 DIAGNOSIS — K859 Acute pancreatitis without necrosis or infection, unspecified: ICD-10-CM

## 2018-02-25 DIAGNOSIS — I2510 Atherosclerotic heart disease of native coronary artery without angina pectoris: ICD-10-CM

## 2018-02-25 DIAGNOSIS — E785 Hyperlipidemia, unspecified: ICD-10-CM

## 2018-02-25 DIAGNOSIS — R0600 Dyspnea, unspecified: ICD-10-CM

## 2018-02-25 DIAGNOSIS — I10 Essential (primary) hypertension: Principal | ICD-10-CM

## 2018-02-25 MED ORDER — GABAPENTIN 300 MG PO CAP
600 mg | ORAL_CAPSULE | Freq: Three times a day (TID) | ORAL | 3 refills | Status: SS
Start: 2018-02-25 — End: 2018-04-06

## 2018-02-25 MED ORDER — AMITRIPTYLINE(#)/GABAPENTIN/EMU OIL 4/4/10%IN HRT TOPICAL CRM
4 g | Freq: Three times a day (TID) | TOPICAL | 3 refills | Status: AC | PRN
Start: 2018-02-25 — End: 2018-04-01

## 2018-02-26 ENCOUNTER — Ambulatory Visit: Admit: 2018-02-25 | Discharge: 2018-02-26 | Payer: MEDICARE

## 2018-02-26 DIAGNOSIS — G546 Phantom limb syndrome with pain: ICD-10-CM

## 2018-02-26 DIAGNOSIS — R2689 Other abnormalities of gait and mobility: ICD-10-CM

## 2018-02-26 DIAGNOSIS — Z89512 Acquired absence of left leg below knee: Principal | ICD-10-CM

## 2018-02-28 ENCOUNTER — Encounter: Admit: 2018-02-28 | Discharge: 2018-02-28 | Payer: MEDICARE

## 2018-03-01 ENCOUNTER — Encounter: Admit: 2018-03-01 | Discharge: 2018-03-01 | Payer: MEDICARE

## 2018-03-01 DIAGNOSIS — E1151 Type 2 diabetes mellitus with diabetic peripheral angiopathy without gangrene: ICD-10-CM

## 2018-03-01 DIAGNOSIS — R0600 Dyspnea, unspecified: ICD-10-CM

## 2018-03-01 DIAGNOSIS — E119 Type 2 diabetes mellitus without complications: ICD-10-CM

## 2018-03-01 DIAGNOSIS — K859 Acute pancreatitis without necrosis or infection, unspecified: ICD-10-CM

## 2018-03-01 DIAGNOSIS — F17200 Nicotine dependence, unspecified, uncomplicated: ICD-10-CM

## 2018-03-01 DIAGNOSIS — Z89512 Acquired absence of left leg below knee: ICD-10-CM

## 2018-03-01 DIAGNOSIS — I130 Hypertensive heart and chronic kidney disease with heart failure and stage 1 through stage 4 chronic kidney disease, or unspecified chronic kidney disease: ICD-10-CM

## 2018-03-01 DIAGNOSIS — I509 Heart failure, unspecified: ICD-10-CM

## 2018-03-01 DIAGNOSIS — I868 Varicose veins of other specified sites: ICD-10-CM

## 2018-03-01 DIAGNOSIS — Z87891 Personal history of nicotine dependence: ICD-10-CM

## 2018-03-01 DIAGNOSIS — D631 Anemia in chronic kidney disease: Principal | ICD-10-CM

## 2018-03-01 DIAGNOSIS — N184 Chronic kidney disease, stage 4 (severe): ICD-10-CM

## 2018-03-01 DIAGNOSIS — I739 Peripheral vascular disease, unspecified: ICD-10-CM

## 2018-03-01 DIAGNOSIS — E785 Hyperlipidemia, unspecified: ICD-10-CM

## 2018-03-01 DIAGNOSIS — N189 Chronic kidney disease, unspecified: ICD-10-CM

## 2018-03-01 DIAGNOSIS — I2510 Atherosclerotic heart disease of native coronary artery without angina pectoris: ICD-10-CM

## 2018-03-01 DIAGNOSIS — I10 Essential (primary) hypertension: Principal | ICD-10-CM

## 2018-03-01 LAB — CBC AND DIFF
Lab: 0.4 10*3/uL (ref 0–0.80)
Lab: 0.6 10*3/uL — ABNORMAL LOW (ref 1.0–4.8)
Lab: 1 % (ref 0–2)
Lab: 16 % — ABNORMAL HIGH (ref 11–15)
Lab: 2 % (ref 0–5)
Lab: 2.7 M/UL — ABNORMAL LOW (ref 4.4–5.5)
Lab: 26 % — ABNORMAL LOW (ref 40–50)
Lab: 31 pg (ref 26–34)
Lab: 33 g/dL (ref 32.0–36.0)
Lab: 4.3 10*3/uL (ref 1.8–7.0)
Lab: 5.5 10*3/uL (ref 4.5–11.0)
Lab: 7 % (ref 4–12)
Lab: 78 % — ABNORMAL HIGH (ref 41–77)
Lab: 8.8 g/dL — ABNORMAL LOW (ref 13.5–16.5)
Lab: 9.8 FL (ref 7–11)
Lab: 91 10*3/uL — ABNORMAL LOW (ref 150–400)
Lab: 93 FL (ref 80–100)

## 2018-03-01 LAB — COMPREHENSIVE METABOLIC PANEL
Lab: 0.3 mg/dL (ref 0.3–1.2)
Lab: 104 MMOL/L (ref 98–110)
Lab: 137 MMOL/L (ref 137–147)
Lab: 2 mg/dL — ABNORMAL HIGH (ref 0.4–1.24)
Lab: 301 mg/dL — ABNORMAL HIGH (ref 70–100)
Lab: 4.6 MMOL/L (ref 3.5–5.1)
Lab: 4.7 g/dL — ABNORMAL LOW (ref 6.0–8.0)
Lab: 68 mg/dL — ABNORMAL HIGH (ref 7–25)
Lab: 7.6 mg/dL — ABNORMAL LOW (ref 8.5–10.6)

## 2018-03-01 MED ORDER — DARBEPOETIN ALFA IN POLYSORBAT 40 MCG/0.4 ML IJ SYRG
40 ug | Freq: Once | SUBCUTANEOUS | 0 refills | Status: CP
Start: 2018-03-01 — End: ?
  Administered 2018-03-01: 14:00:00 40 ug via SUBCUTANEOUS

## 2018-03-03 ENCOUNTER — Encounter: Admit: 2018-03-03 | Discharge: 2018-03-03 | Payer: MEDICARE

## 2018-03-03 DIAGNOSIS — I10 Essential (primary) hypertension: Principal | ICD-10-CM

## 2018-03-03 DIAGNOSIS — I2510 Atherosclerotic heart disease of native coronary artery without angina pectoris: ICD-10-CM

## 2018-03-03 DIAGNOSIS — K859 Acute pancreatitis without necrosis or infection, unspecified: ICD-10-CM

## 2018-03-03 DIAGNOSIS — R0600 Dyspnea, unspecified: ICD-10-CM

## 2018-03-03 DIAGNOSIS — E785 Hyperlipidemia, unspecified: ICD-10-CM

## 2018-03-03 DIAGNOSIS — N189 Chronic kidney disease, unspecified: ICD-10-CM

## 2018-03-03 DIAGNOSIS — I739 Peripheral vascular disease, unspecified: ICD-10-CM

## 2018-03-03 DIAGNOSIS — F17200 Nicotine dependence, unspecified, uncomplicated: ICD-10-CM

## 2018-03-03 DIAGNOSIS — E119 Type 2 diabetes mellitus without complications: ICD-10-CM

## 2018-03-03 MED ORDER — DARBEPOETIN ALFA IN POLYSORBAT 40 MCG/0.4 ML IJ SYRG
40 ug | Freq: Once | SUBCUTANEOUS | 0 refills | Status: CN
Start: 2018-03-03 — End: ?

## 2018-03-04 ENCOUNTER — Encounter: Admit: 2018-03-04 | Discharge: 2018-03-04 | Payer: MEDICARE

## 2018-03-04 ENCOUNTER — Ambulatory Visit: Admit: 2018-03-04 | Discharge: 2018-03-05 | Payer: MEDICARE

## 2018-03-04 ENCOUNTER — Ambulatory Visit: Admit: 2018-03-04 | Discharge: 2018-03-04 | Payer: MEDICARE

## 2018-03-04 DIAGNOSIS — E785 Hyperlipidemia, unspecified: ICD-10-CM

## 2018-03-04 DIAGNOSIS — I739 Peripheral vascular disease, unspecified: Principal | ICD-10-CM

## 2018-03-04 DIAGNOSIS — N184 Chronic kidney disease, stage 4 (severe): ICD-10-CM

## 2018-03-04 DIAGNOSIS — F17200 Nicotine dependence, unspecified, uncomplicated: ICD-10-CM

## 2018-03-04 DIAGNOSIS — I2510 Atherosclerotic heart disease of native coronary artery without angina pectoris: ICD-10-CM

## 2018-03-04 DIAGNOSIS — E1151 Type 2 diabetes mellitus with diabetic peripheral angiopathy without gangrene: ICD-10-CM

## 2018-03-04 DIAGNOSIS — I10 Essential (primary) hypertension: Principal | ICD-10-CM

## 2018-03-04 DIAGNOSIS — E119 Type 2 diabetes mellitus without complications: ICD-10-CM

## 2018-03-04 DIAGNOSIS — N189 Chronic kidney disease, unspecified: ICD-10-CM

## 2018-03-04 DIAGNOSIS — R0600 Dyspnea, unspecified: ICD-10-CM

## 2018-03-04 DIAGNOSIS — K859 Acute pancreatitis without necrosis or infection, unspecified: ICD-10-CM

## 2018-03-04 DIAGNOSIS — Z9862 Peripheral vascular angioplasty status: ICD-10-CM

## 2018-03-04 DIAGNOSIS — E782 Mixed hyperlipidemia: ICD-10-CM

## 2018-03-05 ENCOUNTER — Encounter: Admit: 2018-03-05 | Discharge: 2018-03-05 | Payer: MEDICARE

## 2018-03-05 DIAGNOSIS — N189 Chronic kidney disease, unspecified: ICD-10-CM

## 2018-03-05 DIAGNOSIS — Z794 Long term (current) use of insulin: Secondary | ICD-10-CM

## 2018-03-05 DIAGNOSIS — I739 Peripheral vascular disease, unspecified: ICD-10-CM

## 2018-03-05 DIAGNOSIS — K922 Gastrointestinal hemorrhage, unspecified: Secondary | ICD-10-CM

## 2018-03-05 DIAGNOSIS — D649 Anemia, unspecified: Secondary | ICD-10-CM

## 2018-03-05 DIAGNOSIS — R0600 Dyspnea, unspecified: ICD-10-CM

## 2018-03-05 DIAGNOSIS — E1122 Type 2 diabetes mellitus with diabetic chronic kidney disease: Secondary | ICD-10-CM

## 2018-03-05 DIAGNOSIS — I10 Essential (primary) hypertension: Principal | ICD-10-CM

## 2018-03-05 DIAGNOSIS — F17200 Nicotine dependence, unspecified, uncomplicated: ICD-10-CM

## 2018-03-05 DIAGNOSIS — E119 Type 2 diabetes mellitus without complications: ICD-10-CM

## 2018-03-05 DIAGNOSIS — E785 Hyperlipidemia, unspecified: ICD-10-CM

## 2018-03-05 DIAGNOSIS — I2510 Atherosclerotic heart disease of native coronary artery without angina pectoris: ICD-10-CM

## 2018-03-05 DIAGNOSIS — K859 Acute pancreatitis without necrosis or infection, unspecified: ICD-10-CM

## 2018-03-05 MED ORDER — AMLODIPINE 10 MG PO TAB
10 mg | ORAL_TABLET | Freq: Every day | ORAL | 0 refills | Status: AC
Start: 2018-03-05 — End: ?

## 2018-03-05 MED ORDER — CLONIDINE HCL 0.1 MG PO TAB
.1 mg | Freq: Once | ORAL | 0 refills | Status: CP
Start: 2018-03-05 — End: ?
  Administered 2018-03-05 (×2): 0.1 mg via ORAL

## 2018-03-05 MED ORDER — HYDRALAZINE 100 MG PO TAB
100 mg | ORAL_TABLET | Freq: Three times a day (TID) | ORAL | 1 refills | 30.00000 days | Status: AC
Start: 2018-03-05 — End: ?

## 2018-03-06 ENCOUNTER — Ambulatory Visit: Admit: 2018-03-05 | Discharge: 2018-03-06 | Payer: MEDICARE

## 2018-03-06 ENCOUNTER — Encounter: Admit: 2018-03-06 | Discharge: 2018-03-06 | Payer: MEDICARE

## 2018-03-06 DIAGNOSIS — N183 Chronic kidney disease, stage 3 (moderate): ICD-10-CM

## 2018-03-06 DIAGNOSIS — I2510 Atherosclerotic heart disease of native coronary artery without angina pectoris: ICD-10-CM

## 2018-03-06 DIAGNOSIS — I739 Peripheral vascular disease, unspecified: ICD-10-CM

## 2018-03-06 DIAGNOSIS — Z23 Encounter for immunization: Principal | ICD-10-CM

## 2018-03-06 DIAGNOSIS — I10 Essential (primary) hypertension: Principal | ICD-10-CM

## 2018-03-06 DIAGNOSIS — E785 Hyperlipidemia, unspecified: ICD-10-CM

## 2018-03-06 DIAGNOSIS — I429 Cardiomyopathy, unspecified: ICD-10-CM

## 2018-03-06 DIAGNOSIS — F17200 Nicotine dependence, unspecified, uncomplicated: ICD-10-CM

## 2018-03-06 DIAGNOSIS — E119 Type 2 diabetes mellitus without complications: ICD-10-CM

## 2018-03-06 DIAGNOSIS — R0600 Dyspnea, unspecified: ICD-10-CM

## 2018-03-06 DIAGNOSIS — I748 Embolism and thrombosis of other arteries: Secondary | ICD-10-CM

## 2018-03-06 DIAGNOSIS — K859 Acute pancreatitis without necrosis or infection, unspecified: ICD-10-CM

## 2018-03-06 DIAGNOSIS — N189 Chronic kidney disease, unspecified: ICD-10-CM

## 2018-03-07 ENCOUNTER — Encounter: Admit: 2018-03-07 | Discharge: 2018-03-07 | Payer: MEDICARE

## 2018-03-20 ENCOUNTER — Encounter: Admit: 2018-03-20 | Discharge: 2018-03-20 | Payer: MEDICARE

## 2018-03-21 ENCOUNTER — Encounter: Admit: 2018-03-21 | Discharge: 2018-03-21 | Payer: MEDICARE

## 2018-03-22 ENCOUNTER — Ambulatory Visit: Admit: 2018-03-22 | Discharge: 2018-03-23 | Payer: MEDICARE

## 2018-03-22 ENCOUNTER — Encounter: Admit: 2018-03-22 | Discharge: 2018-03-22 | Payer: MEDICARE

## 2018-03-22 DIAGNOSIS — I5020 Unspecified systolic (congestive) heart failure: Principal | ICD-10-CM

## 2018-03-29 ENCOUNTER — Encounter: Admit: 2018-03-29 | Discharge: 2018-03-29 | Payer: MEDICARE

## 2018-04-01 ENCOUNTER — Emergency Department: Admit: 2018-04-01 | Discharge: 2018-04-01 | Payer: MEDICARE

## 2018-04-01 ENCOUNTER — Encounter: Admit: 2018-04-01 | Discharge: 2018-04-01 | Payer: MEDICARE

## 2018-04-01 DIAGNOSIS — I10 Essential (primary) hypertension: Principal | ICD-10-CM

## 2018-04-01 DIAGNOSIS — E119 Type 2 diabetes mellitus without complications: ICD-10-CM

## 2018-04-01 DIAGNOSIS — K859 Acute pancreatitis without necrosis or infection, unspecified: ICD-10-CM

## 2018-04-01 DIAGNOSIS — Z9119 Patient's noncompliance with other medical treatment and regimen: Secondary | ICD-10-CM

## 2018-04-01 DIAGNOSIS — R0603 Acute respiratory distress: ICD-10-CM

## 2018-04-01 DIAGNOSIS — R739 Hyperglycemia, unspecified: ICD-10-CM

## 2018-04-01 DIAGNOSIS — N189 Chronic kidney disease, unspecified: ICD-10-CM

## 2018-04-01 DIAGNOSIS — I2510 Atherosclerotic heart disease of native coronary artery without angina pectoris: ICD-10-CM

## 2018-04-01 DIAGNOSIS — E785 Hyperlipidemia, unspecified: ICD-10-CM

## 2018-04-01 DIAGNOSIS — R0600 Dyspnea, unspecified: ICD-10-CM

## 2018-04-01 DIAGNOSIS — F17200 Nicotine dependence, unspecified, uncomplicated: ICD-10-CM

## 2018-04-01 DIAGNOSIS — I161 Hypertensive emergency: Secondary | ICD-10-CM

## 2018-04-01 DIAGNOSIS — I739 Peripheral vascular disease, unspecified: ICD-10-CM

## 2018-04-01 DIAGNOSIS — J189 Pneumonia, unspecified organism: ICD-10-CM

## 2018-04-01 DIAGNOSIS — T8789 Other complications of amputation stump: ICD-10-CM

## 2018-04-01 LAB — COMPREHENSIVE METABOLIC PANEL
Lab: 0.3 mg/dL (ref 0.3–1.2)
Lab: 11 10*3/uL — ABNORMAL HIGH (ref 3–12)
Lab: 113 mg/dL — ABNORMAL HIGH (ref 7–25)
Lab: 129 MMOL/L — ABNORMAL LOW (ref 137–147)
Lab: 129 U/L — ABNORMAL HIGH (ref 25–110)
Lab: 20 mL/min — ABNORMAL LOW (ref >60)
Lab: 24 mL/min — ABNORMAL LOW (ref >60)
Lab: 25 MMOL/L (ref 21–30)
Lab: 3.1 mg/dL — ABNORMAL HIGH (ref 0.4–1.24)
Lab: 3.4 g/dL — ABNORMAL LOW (ref 3.5–5.0)
Lab: 5.3 MMOL/L — ABNORMAL HIGH (ref 3.5–5.1)
Lab: 6.8 g/dL (ref 6.0–8.0)
Lab: 616 mg/dL — ABNORMAL HIGH (ref 70–100)
Lab: 8 U/L (ref 7–40)
Lab: 8 U/L (ref 7–56)
Lab: 8.7 mg/dL — ABNORMAL HIGH (ref 8.5–10.6)
Lab: 93 MMOL/L — ABNORMAL LOW (ref 98–110)

## 2018-04-01 LAB — URINALYSIS MICROSCOPIC REFLEX TO CULTURE

## 2018-04-01 LAB — URINALYSIS DIPSTICK REFLEX TO CULTURE
Lab: 1 (ref 1.003–1.035)
Lab: NEGATIVE
Lab: NEGATIVE

## 2018-04-01 LAB — POC GLUCOSE
Lab: 478 mg/dL — ABNORMAL HIGH (ref 70–100)
Lab: 502 mg/dL — ABNORMAL HIGH (ref 70–100)
Lab: 566 mg/dL — ABNORMAL HIGH (ref 70–100)
Lab: 566 mg/dL — ABNORMAL HIGH (ref >60)
Lab: 592 mg/dL — ABNORMAL HIGH (ref 70–100)
Lab: 593 mg/dL — ABNORMAL HIGH (ref 70–100)
Lab: >60 mg/dL — ABNORMAL HIGH (ref 70–100)

## 2018-04-01 LAB — POC TROPONIN: Lab: 0 ng/mL (ref 0.00–0.05)

## 2018-04-01 LAB — PLEURAL FLUID ALBUMIN: Lab: 1.9 g/dL

## 2018-04-01 LAB — POC LACTATE: Lab: 0.6 MMOL/L (ref 0.5–2.0)

## 2018-04-01 LAB — MAGNESIUM: Lab: 2.3 mg/dL (ref 1.6–2.6)

## 2018-04-01 LAB — PLEURAL FLUID TOTAL PROTEIN: Lab: 3.2 g/dL — ABNORMAL HIGH (ref <1.1)

## 2018-04-01 LAB — HEMOGLOBIN A1C: Lab: 8.5 % — ABNORMAL HIGH (ref 4.0–6.0)

## 2018-04-01 LAB — POC BLOOD GAS VEN
Lab: 25 MMOL/L
Lab: 46 mmHg (ref 36–50)
Lab: 7.3 (ref 7.30–7.40)

## 2018-04-01 LAB — UREA NITROGEN-URINE RANDOM: Lab: 502 mg/dL

## 2018-04-01 LAB — CBC AND DIFF
Lab: 0 10*3/uL (ref 0–0.20)
Lab: 0.1 10*3/uL (ref 0–0.45)
Lab: 9.3 10*3/uL (ref 4.5–11.0)

## 2018-04-01 LAB — PHOSPHORUS: Lab: 4.1 mg/dL (ref 2.0–4.5)

## 2018-04-01 LAB — BNP POC ER: Lab: 524 pg/mL — ABNORMAL HIGH (ref 0–100)

## 2018-04-01 LAB — PLEURAL FLUID LACTATE DEHYDROGENASE: Lab: 136 U/L (ref 67–140)

## 2018-04-01 LAB — POC IONIZED CALCIUM: Lab: 1.1 MMOL/L (ref 1.0–1.3)

## 2018-04-01 LAB — PLEURAL FLUID AMYLASE: Lab: 50 U/L

## 2018-04-01 LAB — POC SODIUM: Lab: 128 MMOL/L — ABNORMAL LOW (ref 137–147)

## 2018-04-01 LAB — TROPONIN-I: Lab: 0 ng/mL (ref 0.0–0.05)

## 2018-04-01 LAB — POC POTASSIUM: Lab: 5.4 MMOL/L — ABNORMAL HIGH (ref 3.5–5.1)

## 2018-04-01 LAB — C REACTIVE PROTEIN (CRP): Lab: 19 mg/dL — ABNORMAL HIGH (ref <1.0)

## 2018-04-01 LAB — CREATININE-URINE RANDOM: Lab: 38 mg/dL

## 2018-04-01 LAB — POC HEMATOCRIT
Lab: 27 % — ABNORMAL LOW (ref 40–50)
Lab: 9.2 g/dL — ABNORMAL LOW (ref 13.5–16.5)

## 2018-04-01 LAB — IONIZED CALCIUM: Lab: 1.1 MMOL/L (ref 1.0–1.3)

## 2018-04-01 LAB — SED RATE: Lab: 61 mm/h — ABNORMAL HIGH (ref 0–20)

## 2018-04-01 LAB — PLEURAL FLUID GLUCOSE: Lab: 560 mg/dL — ABNORMAL HIGH (ref 70–100)

## 2018-04-01 MED ORDER — NITROGLYCERIN IN 5 % DEXTROSE 50 MG/250 ML (200 MCG/ML) IV SOLN
.1-1.5 ug/kg/min | INTRAVENOUS | 0 refills | Status: DC
Start: 2018-04-01 — End: 2018-04-02
  Administered 2018-04-01: 21:00:00 0.9 ug/kg/min via INTRAVENOUS
  Administered 2018-04-01: 12:00:00 0.1 ug/kg/min via INTRAVENOUS
  Administered 2018-04-02: 0.5 ug/kg/min via INTRAVENOUS

## 2018-04-01 MED ORDER — CEFEPIME 1G/100ML NS IVPB (MB+)
1 g | Freq: Once | INTRAVENOUS | 0 refills | Status: CP
Start: 2018-04-01 — End: ?
  Administered 2018-04-01 (×2): 1 g via INTRAVENOUS

## 2018-04-01 MED ORDER — ROPINIROLE 2 MG PO TAB
4 mg | Freq: Once | ORAL | 0 refills | Status: CP
Start: 2018-04-01 — End: ?
  Administered 2018-04-01: 14:00:00 4 mg via ORAL

## 2018-04-01 MED ORDER — FUROSEMIDE 10 MG/ML IJ SOLN
80 mg | Freq: Once | INTRAVENOUS | 0 refills | Status: CP
Start: 2018-04-01 — End: ?
  Administered 2018-04-01: 22:00:00 80 mg via INTRAVENOUS

## 2018-04-01 MED ORDER — BUMETANIDE 0.25 MG/ML IJ SOLN
1 mg | Freq: Once | INTRAVENOUS | 0 refills | Status: CP
Start: 2018-04-01 — End: ?
  Administered 2018-04-01: 12:00:00 1 mg via INTRAVENOUS

## 2018-04-01 MED ORDER — VANCOMYCIN 1G/250ML D5W IVPB (VIAL2BAG)
15 mg/kg | Freq: Once | INTRAVENOUS | 0 refills | Status: CP
Start: 2018-04-01 — End: ?
  Administered 2018-04-01 (×2): 1000 mg via INTRAVENOUS

## 2018-04-01 MED ORDER — INSULIN REGULAR IN 0.9 % NACL 100 UNIT/100 ML (1 UNIT/ML) IV SOLN
1-32 [IU]/h | INTRAVENOUS | 0 refills | Status: DC
Start: 2018-04-01 — End: 2018-04-02
  Administered 2018-04-01: 23:00:00 5 [IU]/h via INTRAVENOUS
  Administered 2018-04-01: 21:00:00 6 [IU]/h via INTRAVENOUS

## 2018-04-01 MED ORDER — CALCIUM GLUCONATE 100 MG/ML (10%) IV SOLN
1 g | Freq: Once | INTRAVENOUS | 0 refills | Status: CP
Start: 2018-04-01 — End: ?
  Administered 2018-04-01: 16:00:00 1 g via INTRAVENOUS

## 2018-04-01 MED ORDER — HEPARIN, PORCINE (PF) 5,000 UNIT/0.5 ML IJ SYRG
5000 [IU] | SUBCUTANEOUS | 0 refills | Status: DC
Start: 2018-04-01 — End: 2018-04-06
  Administered 2018-04-02 – 2018-04-05 (×9): 5000 [IU] via SUBCUTANEOUS

## 2018-04-01 MED ORDER — CEFEPIME 1G/100ML NS IVPB (MB+)
1 g | INTRAVENOUS | 0 refills | Status: DC
Start: 2018-04-01 — End: 2018-04-01

## 2018-04-01 MED ORDER — ALBUTEROL SULFATE 2.5 MG/0.5 ML IN NEBU
2.5 mg | RESPIRATORY_TRACT | 0 refills | Status: DC | PRN
Start: 2018-04-01 — End: 2018-04-06

## 2018-04-01 MED ORDER — PANTOPRAZOLE 40 MG PO TBEC
40 mg | Freq: Two times a day (BID) | ORAL | 0 refills | Status: DC
Start: 2018-04-01 — End: 2018-04-06
  Administered 2018-04-02 – 2018-04-06 (×10): 40 mg via ORAL

## 2018-04-01 MED ORDER — ATORVASTATIN 40 MG PO TAB
40 mg | Freq: Every evening | ORAL | 0 refills | Status: DC
Start: 2018-04-01 — End: 2018-04-06
  Administered 2018-04-02 – 2018-04-06 (×5): 40 mg via ORAL

## 2018-04-01 MED ORDER — AMLODIPINE 10 MG PO TAB
10 mg | Freq: Every day | ORAL | 0 refills | Status: DC
Start: 2018-04-01 — End: 2018-04-06
  Administered 2018-04-01 – 2018-04-06 (×6): 10 mg via ORAL

## 2018-04-01 MED ORDER — METRONIDAZOLE IN NACL (ISO-OS) 500 MG/100 ML IV PGBK
500 mg | Freq: Once | INTRAVENOUS | 0 refills | Status: DC
Start: 2018-04-01 — End: 2018-04-01

## 2018-04-01 MED ORDER — GABAPENTIN 300 MG PO CAP
300 mg | Freq: Every day | ORAL | 0 refills | Status: DC
Start: 2018-04-01 — End: 2018-04-02

## 2018-04-01 MED ORDER — METOLAZONE 2.5 MG PO TAB
5 mg | Freq: Every day | ORAL | 0 refills | Status: DC
Start: 2018-04-01 — End: 2018-04-02
  Administered 2018-04-01: 21:00:00 5 mg via ORAL

## 2018-04-01 MED ORDER — FOLIC ACID 1 MG PO TAB
1 mg | Freq: Every day | ORAL | 0 refills | Status: DC
Start: 2018-04-01 — End: 2018-04-06
  Administered 2018-04-01 – 2018-04-06 (×6): 1 mg via ORAL

## 2018-04-01 MED ORDER — NITROGLYCERIN 0.4 MG SL SUBL
.4 mg | Freq: Once | SUBLINGUAL | 0 refills | Status: DC
Start: 2018-04-01 — End: 2018-04-01

## 2018-04-01 MED ORDER — INSULIN REGULAR HUMAN(#) 1 UNIT/ML IJ SYRINGE
7 [IU] | Freq: Once | INTRAVENOUS | 0 refills | Status: CP
Start: 2018-04-01 — End: ?
  Administered 2018-04-01: 14:00:00 7 [IU] via INTRAVENOUS

## 2018-04-01 MED ORDER — FERROUS SULFATE 325 MG (65 MG IRON) PO TAB
650 mg | Freq: Every day | ORAL | 0 refills | Status: DC
Start: 2018-04-01 — End: 2018-04-03
  Administered 2018-04-01 – 2018-04-03 (×3): 650 mg via ORAL

## 2018-04-01 MED ORDER — POLYETHYLENE GLYCOL 3350 17 GRAM PO PWPK
1 | Freq: Every day | ORAL | 0 refills | Status: DC
Start: 2018-04-01 — End: 2018-04-02
  Administered 2018-04-01 – 2018-04-02 (×2): 17 g via ORAL

## 2018-04-02 LAB — BASIC METABOLIC PANEL
Lab: 112 mg/dL — ABNORMAL HIGH (ref 7–25)
Lab: 130 MMOL/L — ABNORMAL LOW (ref <20.0)
Lab: 21 mL/min — ABNORMAL LOW (ref >60)
Lab: 26 MMOL/L (ref 21–30)
Lab: 3 mg/dL — ABNORMAL HIGH (ref 0.4–1.24)
Lab: 4.2 MMOL/L (ref 3.5–5.1)
Lab: 8.3 mg/dL — ABNORMAL LOW (ref 8.5–10.6)
Lab: 9 (ref 3–12)
Lab: 95 MMOL/L — ABNORMAL LOW (ref 98–110)
Lab: 10 (ref 3–12)
Lab: 100 MMOL/L (ref 98–110)
Lab: 108 mg/dL — ABNORMAL HIGH (ref 7–25)
Lab: 138 MMOL/L (ref 137–147)
Lab: 148 mg/dL — ABNORMAL HIGH (ref 70–100)
Lab: 2.8 mg/dL — ABNORMAL HIGH (ref 0.4–1.24)
Lab: 22 mL/min — ABNORMAL LOW (ref >60)
Lab: 27 mL/min — ABNORMAL LOW (ref >60)
Lab: 28 MMOL/L (ref 21–30)
Lab: 8.3 mg/dL — ABNORMAL LOW (ref 8.5–10.6)
Lab: 134 MMOL/L — ABNORMAL LOW (ref 137–147)

## 2018-04-02 LAB — POC GLUCOSE
Lab: 110 mg/dL — ABNORMAL HIGH (ref 70–100)
Lab: 120 mg/dL — ABNORMAL HIGH (ref 70–100)
Lab: 121 mg/dL — ABNORMAL HIGH (ref 70–100)
Lab: 126 mg/dL — ABNORMAL HIGH (ref 70–100)
Lab: 129 mg/dL — ABNORMAL HIGH (ref 70–100)
Lab: 144 mg/dL — ABNORMAL HIGH (ref 70–100)
Lab: 176 mg/dL — ABNORMAL HIGH (ref 70–100)
Lab: 227 mg/dL — ABNORMAL HIGH (ref 70–100)
Lab: 243 mg/dL — ABNORMAL HIGH (ref 70–100)
Lab: 290 mg/dL — ABNORMAL HIGH (ref 70–100)
Lab: 314 mg/dL — ABNORMAL HIGH (ref 70–100)
Lab: 379 mg/dL — ABNORMAL HIGH (ref 70–100)
Lab: 404 mg/dL — ABNORMAL HIGH (ref 70–100)
Lab: 418 mg/dL — ABNORMAL HIGH (ref 70–100)

## 2018-04-02 LAB — CBC
Lab: 4.3 K/UL — ABNORMAL LOW (ref 4.5–11.0)
Lab: 14 % (ref >60)
Lab: 152 K/UL (ref >60)
Lab: 2.2 M/UL — ABNORMAL LOW (ref 4.4–5.5)
Lab: 20 % — ABNORMAL LOW (ref 40–50)
Lab: 31 pg (ref 26–34)
Lab: 35 g/dL (ref 32.0–36.0)
Lab: 4 10*3/uL — ABNORMAL LOW (ref 4.5–11.0)
Lab: 7.1 g/dL — ABNORMAL LOW (ref 13.5–16.5)
Lab: 88 FL (ref 80–100)
Lab: 9.6 FL (ref 7–11)
Lab: 15 % — ABNORMAL HIGH (ref 11–15)
Lab: 159 10*3/uL (ref 150–400)
Lab: 2.4 M/UL — ABNORMAL LOW (ref 4.4–5.5)
Lab: 21 % — ABNORMAL LOW (ref 40–50)
Lab: 29 pg (ref 26–34)
Lab: 33 g/dL (ref 32.0–36.0)
Lab: 5.2 10*3/uL (ref 4.5–11.0)
Lab: 7.3 g/dL — ABNORMAL LOW (ref 13.5–16.5)
Lab: 88 FL (ref 80–100)
Lab: 9.2 FL (ref 7–11)

## 2018-04-02 LAB — BLOOD GASES, ARTERIAL
Lab: 2.7 MMOL/L
Lab: 26 MMOL/L (ref 21–28)
Lab: 45 mmHg (ref 35–45)
Lab: 7.4 (ref 7.35–7.45)
Lab: 97 mmHg (ref 80–100)
Lab: 98 % (ref 95–99)

## 2018-04-02 LAB — PHOSPHORUS: Lab: 4.3 mg/dL — ABNORMAL LOW (ref 2.0–4.5)

## 2018-04-02 LAB — PLEURAL FLUID PH: Lab: 7.4 % — ABNORMAL LOW (ref 7.60–7.66)

## 2018-04-02 LAB — MAGNESIUM
Lab: 2.4 mg/dL (ref >60)
Lab: 2.3 mg/dL (ref 1.6–2.6)
Lab: 2.3 mg/dL — ABNORMAL LOW (ref 1.6–2.6)

## 2018-04-02 LAB — GRAM STAIN

## 2018-04-02 LAB — CELL COUNT W/DIFF-FLUIDS: Lab: 220 /uL

## 2018-04-02 LAB — COMPREHENSIVE METABOLIC PANEL: Lab: 136 MMOL/L — ABNORMAL LOW (ref >40)

## 2018-04-02 MED ORDER — TAMSULOSIN 0.4 MG PO CAP
0.4 mg | Freq: Every day | ORAL | 0 refills | Status: DC
Start: 2018-04-02 — End: 2018-04-06
  Administered 2018-04-02 – 2018-04-06 (×5): 0.4 mg via ORAL

## 2018-04-02 MED ORDER — FUROSEMIDE 10 MG/ML IJ SOLN
80 mg | INTRAVENOUS | 0 refills | Status: DC
Start: 2018-04-02 — End: 2018-04-04
  Administered 2018-04-02 – 2018-04-04 (×5): 80 mg via INTRAVENOUS

## 2018-04-02 MED ORDER — INSULIN GLARGINE 100 UNIT/ML (3 ML) SC INJ PEN
20 [IU] | Freq: Every evening | SUBCUTANEOUS | 0 refills | Status: DC
Start: 2018-04-02 — End: 2018-04-03

## 2018-04-02 MED ORDER — LACTULOSE 10 GRAM/15 ML PO SOLN
30 mL | Freq: Once | ORAL | 0 refills | Status: CP
Start: 2018-04-02 — End: ?
  Administered 2018-04-02: 11:00:00 20 g via ORAL

## 2018-04-02 MED ORDER — METOLAZONE 2.5 MG PO TAB
5 mg | Freq: Once | ORAL | 0 refills | Status: CP
Start: 2018-04-02 — End: ?
  Administered 2018-04-03: 5 mg via ORAL

## 2018-04-02 MED ORDER — INSULIN ASPART 100 UNIT/ML SC FLEXPEN
0-12 [IU] | Freq: Before meals | SUBCUTANEOUS | 0 refills | Status: DC
Start: 2018-04-02 — End: 2018-04-03
  Administered 2018-04-02: 20:00:00 8 [IU] via SUBCUTANEOUS

## 2018-04-02 MED ORDER — POLYETHYLENE GLYCOL 3350 17 GRAM PO PWPK
1 | Freq: Two times a day (BID) | ORAL | 0 refills | Status: DC
Start: 2018-04-02 — End: 2018-04-06
  Administered 2018-04-03 – 2018-04-04 (×3): 17 g via ORAL

## 2018-04-02 MED ORDER — GABAPENTIN 100 MG PO CAP
100 mg | Freq: Every day | ORAL | 0 refills | Status: DC
Start: 2018-04-02 — End: 2018-04-06
  Administered 2018-04-02 – 2018-04-06 (×5): 100 mg via ORAL

## 2018-04-02 MED ORDER — CITALOPRAM 20 MG PO TAB
10 mg | Freq: Every day | ORAL | 0 refills | Status: DC
Start: 2018-04-02 — End: 2018-04-06
  Administered 2018-04-02 – 2018-04-06 (×5): 10 mg via ORAL

## 2018-04-02 MED ORDER — INSULIN GLARGINE 100 UNIT/ML (3 ML) SC INJ PEN
10 [IU] | Freq: Once | SUBCUTANEOUS | 0 refills | Status: CP
Start: 2018-04-02 — End: ?
  Administered 2018-04-02: 13:00:00 10 [IU] via SUBCUTANEOUS

## 2018-04-02 MED ORDER — BISACODYL 10 MG RE SUPP
10 mg | Freq: Once | RECTAL | 0 refills | Status: CP
Start: 2018-04-02 — End: ?
  Administered 2018-04-02: 20:00:00 10 mg via RECTAL

## 2018-04-02 MED ORDER — HYDRALAZINE 50 MG PO TAB
50 mg | Freq: Three times a day (TID) | ORAL | 0 refills | Status: DC
Start: 2018-04-02 — End: 2018-04-02
  Administered 2018-04-02: 14:00:00 50 mg via ORAL

## 2018-04-03 LAB — FOLATE, SERUM: Lab: >22 ng/mL — ABNORMAL HIGH (ref >3.9)

## 2018-04-03 LAB — BASIC METABOLIC PANEL
Lab: 104 mg/dL — ABNORMAL HIGH (ref 7–25)
Lab: 134 MMOL/L — ABNORMAL LOW (ref >60)
Lab: 2.6 mg/dL — ABNORMAL HIGH (ref 0.4–1.24)
Lab: 201 mg/dL — ABNORMAL HIGH (ref 70–100)
Lab: 25 mL/min — ABNORMAL LOW (ref >60)
Lab: 30 mL/min — ABNORMAL LOW (ref >60)
Lab: 7 U/L — ABNORMAL HIGH (ref 3–12)
Lab: 8.2 mg/dL — ABNORMAL LOW (ref >60)

## 2018-04-03 LAB — MAGNESIUM
Lab: 2.2 mg/dL (ref 1.6–2.6)
Lab: 2.1 mg/dL (ref >60)

## 2018-04-03 LAB — IRON + BINDING CAPACITY + %SAT+ FERRITIN
Lab: 12 % — ABNORMAL LOW (ref 28–42)
Lab: 210 ug/dL — ABNORMAL LOW (ref 270–380)
Lab: 26 ug/dL — ABNORMAL LOW (ref 50–185)

## 2018-04-03 LAB — CBC: Lab: 5.2 K/UL — ABNORMAL LOW (ref 4.5–11.0)

## 2018-04-03 LAB — POC GLUCOSE
Lab: 453 mg/dL — ABNORMAL HIGH (ref 70–100)
Lab: 439 mg/dL — ABNORMAL HIGH (ref 70–100)
Lab: 294 mg/dL — ABNORMAL HIGH (ref 70–100)
Lab: 199 mg/dL — ABNORMAL HIGH (ref 70–100)
Lab: 228 mg/dL — ABNORMAL HIGH (ref 70–100)
Lab: 316 mg/dL — ABNORMAL HIGH (ref 70–100)
Lab: 285 mg/dL — ABNORMAL HIGH (ref 70–100)

## 2018-04-03 LAB — PHOSPHORUS: Lab: 3 mg/dL — ABNORMAL HIGH (ref 2.0–4.5)

## 2018-04-03 LAB — COMPREHENSIVE METABOLIC PANEL: Lab: 134 MMOL/L — ABNORMAL LOW (ref >60)

## 2018-04-03 LAB — AMMONIA: Lab: 30 umol/L (ref 9–35)

## 2018-04-03 MED ORDER — ACETAZOLAMIDE SODIUM 500 MG IJ SOLR
500 mg | INTRAVENOUS | 0 refills | Status: DC
Start: 2018-04-03 — End: 2018-04-03

## 2018-04-03 MED ORDER — METOLAZONE 2.5 MG PO TAB
10 mg | Freq: Once | ORAL | 0 refills | Status: CP
Start: 2018-04-03 — End: ?
  Administered 2018-04-03: 09:00:00 10 mg via ORAL

## 2018-04-03 MED ORDER — INSULIN ASPART 100 UNIT/ML SC FLEXPEN
0-24 [IU] | Freq: Before meals | SUBCUTANEOUS | 0 refills | Status: DC
Start: 2018-04-03 — End: 2018-04-06
  Administered 2018-04-04: 8 [IU] via SUBCUTANEOUS

## 2018-04-03 MED ORDER — NPH INSULIN HUMAN RECOMB 100 UNIT/ML (3 ML) SC PEN
20 [IU] | Freq: Two times a day (BID) | SUBCUTANEOUS | 0 refills | Status: DC
Start: 2018-04-03 — End: 2018-04-04
  Administered 2018-04-04: 03:00:00 20 [IU] via SUBCUTANEOUS

## 2018-04-03 MED ORDER — ACETAZOLAMIDE SODIUM 500 MG IJ SOLR
500 mg | Freq: Once | INTRAVENOUS | 0 refills | Status: CP
Start: 2018-04-03 — End: ?
  Administered 2018-04-03: 15:00:00 500 mg via INTRAVENOUS

## 2018-04-03 MED ORDER — ROPINIROLE 1 MG PO TAB
2 mg | Freq: Every day | ORAL | 0 refills | Status: DC
Start: 2018-04-03 — End: 2018-04-06
  Administered 2018-04-04 – 2018-04-06 (×3): 2 mg via ORAL

## 2018-04-03 MED ORDER — IMS MIXTURE TEMPLATE
30 mg | Freq: Three times a day (TID) | ORAL | 0 refills | Status: DC
Start: 2018-04-03 — End: 2018-04-06
  Administered 2018-04-03 – 2018-04-06 (×11): 30 mg via ORAL

## 2018-04-03 MED ORDER — FERROUS SULFATE 325 MG (65 MG IRON) PO TAB
325 mg | Freq: Every day | ORAL | 0 refills | Status: DC
Start: 2018-04-03 — End: 2018-04-06
  Administered 2018-04-04 – 2018-04-06 (×3): 325 mg via ORAL

## 2018-04-03 MED ORDER — METOLAZONE 2.5 MG PO TAB
10 mg | Freq: Every day | ORAL | 0 refills | Status: DC
Start: 2018-04-03 — End: 2018-04-03
  Administered 2018-04-03: 14:00:00 10 mg via ORAL

## 2018-04-03 MED ORDER — MELATONIN 5 MG PO TAB
5 mg | Freq: Every evening | ORAL | 0 refills | Status: DC | PRN
Start: 2018-04-03 — End: 2018-04-06
  Administered 2018-04-04 – 2018-04-06 (×3): 5 mg via ORAL

## 2018-04-04 LAB — BASIC METABOLIC PANEL
Lab: 111 mg/dL — ABNORMAL HIGH (ref 7–25)
Lab: 132 MMOL/L — ABNORMAL LOW (ref 137–147)
Lab: 21 mL/min — ABNORMAL LOW (ref >60)
Lab: 25 mL/min — ABNORMAL LOW (ref >60)
Lab: 26 MMOL/L (ref 21–30)
Lab: 288 mg/dL — ABNORMAL HIGH (ref 70–100)
Lab: 3 mg/dL — ABNORMAL HIGH (ref 0.4–1.24)
Lab: 8.1 mg/dL — ABNORMAL LOW (ref 8.5–10.6)
Lab: 94 MMOL/L — ABNORMAL LOW (ref 98–110)
Lab: 138 MMOL/L (ref 137–147)

## 2018-04-04 LAB — PHOSPHORUS: Lab: 4.1 mg/dL — ABNORMAL LOW (ref >60)

## 2018-04-04 LAB — POC GLUCOSE
Lab: 374 mg/dL — ABNORMAL HIGH (ref 70–100)
Lab: 226 mg/dL — ABNORMAL HIGH (ref 70–100)
Lab: 241 mg/dL — ABNORMAL HIGH (ref 70–100)
Lab: 223 mg/dL — ABNORMAL HIGH (ref 70–100)

## 2018-04-04 LAB — MAGNESIUM
Lab: 2.2 mg/dL (ref 1.6–2.6)
Lab: 2.2 mg/dL (ref 1.6–2.6)

## 2018-04-04 LAB — CBC
Lab: 2.5 M/UL — ABNORMAL LOW (ref 4.4–5.5)
Lab: 5.3 K/UL — ABNORMAL LOW (ref 4.5–11.0)

## 2018-04-04 MED ORDER — INSULIN GLARGINE 100 UNIT/ML (3 ML) SC INJ PEN
26 [IU] | Freq: Every evening | SUBCUTANEOUS | 0 refills | Status: DC
Start: 2018-04-04 — End: 2018-04-06
  Administered 2018-04-05: 03:00:00 26 [IU] via SUBCUTANEOUS

## 2018-04-05 LAB — COMPREHENSIVE METABOLIC PANEL
Lab: 0.2 mg/dL — ABNORMAL LOW (ref 0.3–1.2)
Lab: 107 U/L (ref 25–110)
Lab: 111 mg/dL — ABNORMAL HIGH (ref 7–25)
Lab: 135 MMOL/L — ABNORMAL LOW (ref 137–147)
Lab: 20 mL/min — ABNORMAL LOW (ref >60)
Lab: 24 mL/min — ABNORMAL LOW (ref >60)
Lab: 281 mg/dL — ABNORMAL HIGH (ref 70–100)
Lab: 3 g/dL — ABNORMAL LOW (ref 3.5–5.0)
Lab: 3.1 mg/dL — ABNORMAL HIGH (ref 0.4–1.24)
Lab: 30 MMOL/L (ref 21–30)
Lab: 4 U/L — ABNORMAL LOW (ref 7–56)
Lab: 6.1 g/dL (ref 6.0–8.0)
Lab: 7 U/L (ref 7–40)
Lab: 8.6 mg/dL — ABNORMAL HIGH (ref 8.5–10.6)
Lab: 11 (ref 3–12)

## 2018-04-05 LAB — POC GLUCOSE
Lab: 420 mg/dL — ABNORMAL HIGH (ref 70–100)
Lab: 274 mg/dL — ABNORMAL HIGH (ref 70–100)
Lab: 273 mg/dL — ABNORMAL HIGH (ref 70–100)

## 2018-04-05 LAB — MAGNESIUM: Lab: 2.4 mg/dL — ABNORMAL LOW (ref 1.6–2.6)

## 2018-04-05 LAB — PHOSPHORUS: Lab: 4.6 mg/dL — ABNORMAL HIGH (ref 2.0–4.5)

## 2018-04-05 LAB — CBC: Lab: 5.6 10*3/uL (ref 4.5–11.0)

## 2018-04-05 MED ORDER — ACETAMINOPHEN 500 MG PO TAB
1000 mg | ORAL | 0 refills | Status: DC | PRN
Start: 2018-04-05 — End: 2018-04-06

## 2018-04-06 ENCOUNTER — Emergency Department: Admit: 2018-04-01 | Discharge: 2018-04-01 | Payer: MEDICARE

## 2018-04-06 ENCOUNTER — Encounter: Admit: 2018-04-06 | Discharge: 2018-04-06 | Payer: MEDICARE

## 2018-04-06 ENCOUNTER — Inpatient Hospital Stay: Admit: 2018-04-01 | Discharge: 2018-04-01 | Payer: MEDICARE

## 2018-04-06 ENCOUNTER — Inpatient Hospital Stay: Admit: 2018-04-01 | Discharge: 2018-04-06 | Disposition: A | Discharge status: Home / Self Care | Payer: MEDICARE

## 2018-04-06 DIAGNOSIS — I161 Hypertensive emergency: ICD-10-CM

## 2018-04-06 DIAGNOSIS — J9601 Acute respiratory failure with hypoxia: ICD-10-CM

## 2018-04-06 DIAGNOSIS — E875 Hyperkalemia: ICD-10-CM

## 2018-04-06 DIAGNOSIS — I5033 Acute on chronic diastolic (congestive) heart failure: ICD-10-CM

## 2018-04-06 DIAGNOSIS — I130 Hypertensive heart and chronic kidney disease with heart failure and stage 1 through stage 4 chronic kidney disease, or unspecified chronic kidney disease: Principal | ICD-10-CM

## 2018-04-06 DIAGNOSIS — I429 Cardiomyopathy, unspecified: ICD-10-CM

## 2018-04-06 DIAGNOSIS — N179 Acute kidney failure, unspecified: ICD-10-CM

## 2018-04-06 DIAGNOSIS — Z9119 Patient's noncompliance with other medical treatment and regimen: ICD-10-CM

## 2018-04-06 DIAGNOSIS — Z9114 Patient's other noncompliance with medication regimen: ICD-10-CM

## 2018-04-06 DIAGNOSIS — Z89512 Acquired absence of left leg below knee: ICD-10-CM

## 2018-04-06 DIAGNOSIS — Z9861 Coronary angioplasty status: ICD-10-CM

## 2018-04-06 DIAGNOSIS — D631 Anemia in chronic kidney disease: ICD-10-CM

## 2018-04-06 DIAGNOSIS — F0390 Unspecified dementia without behavioral disturbance: ICD-10-CM

## 2018-04-06 DIAGNOSIS — E1122 Type 2 diabetes mellitus with diabetic chronic kidney disease: ICD-10-CM

## 2018-04-06 DIAGNOSIS — Z794 Long term (current) use of insulin: ICD-10-CM

## 2018-04-06 DIAGNOSIS — K5900 Constipation, unspecified: ICD-10-CM

## 2018-04-06 DIAGNOSIS — K766 Portal hypertension: ICD-10-CM

## 2018-04-06 DIAGNOSIS — E871 Hypo-osmolality and hyponatremia: ICD-10-CM

## 2018-04-06 DIAGNOSIS — G4733 Obstructive sleep apnea (adult) (pediatric): ICD-10-CM

## 2018-04-06 DIAGNOSIS — E1151 Type 2 diabetes mellitus with diabetic peripheral angiopathy without gangrene: ICD-10-CM

## 2018-04-06 DIAGNOSIS — F419 Anxiety disorder, unspecified: ICD-10-CM

## 2018-04-06 DIAGNOSIS — G546 Phantom limb syndrome with pain: ICD-10-CM

## 2018-04-06 DIAGNOSIS — R339 Retention of urine, unspecified: ICD-10-CM

## 2018-04-06 DIAGNOSIS — G2581 Restless legs syndrome: ICD-10-CM

## 2018-04-06 DIAGNOSIS — I2510 Atherosclerotic heart disease of native coronary artery without angina pectoris: ICD-10-CM

## 2018-04-06 DIAGNOSIS — E1100 Type 2 diabetes mellitus with hyperosmolarity without nonketotic hyperglycemic-hyperosmolar coma (NKHHC): ICD-10-CM

## 2018-04-06 DIAGNOSIS — N184 Chronic kidney disease, stage 4 (severe): ICD-10-CM

## 2018-04-06 DIAGNOSIS — Z9049 Acquired absence of other specified parts of digestive tract: ICD-10-CM

## 2018-04-06 DIAGNOSIS — G9341 Metabolic encephalopathy: ICD-10-CM

## 2018-04-06 DIAGNOSIS — K861 Other chronic pancreatitis: ICD-10-CM

## 2018-04-06 DIAGNOSIS — E1140 Type 2 diabetes mellitus with diabetic neuropathy, unspecified: ICD-10-CM

## 2018-04-06 DIAGNOSIS — E785 Hyperlipidemia, unspecified: ICD-10-CM

## 2018-04-06 LAB — COMPREHENSIVE METABOLIC PANEL: Lab: 135 MMOL/L — ABNORMAL LOW (ref 137–147)

## 2018-04-06 LAB — CULTURE-WOUND/TISSUE/FLUID(AEROBIC ONLY)W/SENSITIVITY

## 2018-04-06 LAB — PHOSPHORUS: Lab: 4.6 mg/dL — ABNORMAL HIGH (ref 2.0–4.5)

## 2018-04-06 LAB — POC GLUCOSE
Lab: 276 mg/dL — ABNORMAL HIGH (ref >60)
Lab: 354 mg/dL — ABNORMAL HIGH (ref 70–100)
Lab: 180 mg/dL — ABNORMAL HIGH (ref 70–100)

## 2018-04-06 LAB — CBC: Lab: 5.9 K/UL — ABNORMAL HIGH (ref >60)

## 2018-04-06 LAB — MAGNESIUM: Lab: 2.4 mg/dL — ABNORMAL LOW (ref 1.6–2.6)

## 2018-04-06 MED ORDER — INSULIN GLARGINE 100 UNIT/ML (3 ML) SC INJ PEN
26 [IU] | Freq: Every evening | SUBCUTANEOUS | 0 refills | 60.00000 days | Status: AC
Start: 2018-04-06 — End: ?

## 2018-04-06 MED ORDER — PROPRANOLOL 60 MG PO TAB
30 mg | Freq: Three times a day (TID) | ORAL | 0 refills | Status: AC
Start: 2018-04-06 — End: ?

## 2018-04-06 MED ORDER — TAMSULOSIN 0.4 MG PO CAP
.4 mg | ORAL_CAPSULE | Freq: Every day | ORAL | 1 refills | 90.00000 days | Status: AC
Start: 2018-04-06 — End: ?
  Filled 2018-04-06 (×2): qty 90, 90d supply, fill #1

## 2018-04-06 MED ORDER — ROPINIROLE 4 MG PO TAB
2 mg | Freq: Every evening | ORAL | 0 refills | Status: AC
Start: 2018-04-06 — End: ?

## 2018-04-06 MED ORDER — GABAPENTIN 300 MG PO CAP
600 mg | ORAL_CAPSULE | Freq: Every day | ORAL | 3 refills | Status: AC
Start: 2018-04-06 — End: ?

## 2018-04-06 MED ORDER — BUMETANIDE 1 MG PO TAB
1 mg | ORAL_TABLET | Freq: Two times a day (BID) | ORAL | 1 refills | Status: AC
Start: 2018-04-06 — End: ?

## 2018-04-06 MED ORDER — INSULIN ASPART 100 UNIT/ML SC FLEXPEN
10 [IU] | Freq: Three times a day (TID) | SUBCUTANEOUS | 1 refills | 30.00000 days | Status: AC
Start: 2018-04-06 — End: ?
  Filled 2018-04-06 (×2): qty 45, 50d supply, fill #1

## 2018-04-07 LAB — CULTURE-BLOOD W/SENSITIVITY

## 2018-04-09 ENCOUNTER — Encounter: Admit: 2018-04-09 | Discharge: 2018-04-09 | Payer: MEDICARE

## 2018-05-07 LAB — CULTURE-FUNGAL,OTHER

## 2018-05-20 LAB — CULTURE-TB (AFB)

## 2018-08-06 ENCOUNTER — Encounter: Admit: 2018-08-06 | Discharge: 2018-08-06 | Payer: MEDICARE

## 2018-08-28 ENCOUNTER — Encounter: Admit: 2018-08-28 | Discharge: 2018-08-28 | Payer: MEDICARE

## 2018-11-06 DEATH — deceased

## 2018-11-22 ENCOUNTER — Encounter: Admit: 2018-11-22 | Discharge: 2018-11-22

## 2019-02-05 ENCOUNTER — Encounter: Admit: 2019-02-05 | Discharge: 2019-02-05 | Payer: MEDICARE

## 2019-08-11 IMAGING — MR Abdomen^MRCP
4 of 5 series · 22 of 48 positions shown · non-contrast
Comparison: none

[Series 2: t2_trufi_cor_bh · coronal · 8.0mm · 0.59mm/px · 5 of 16 slices shown]
[im 1/16]
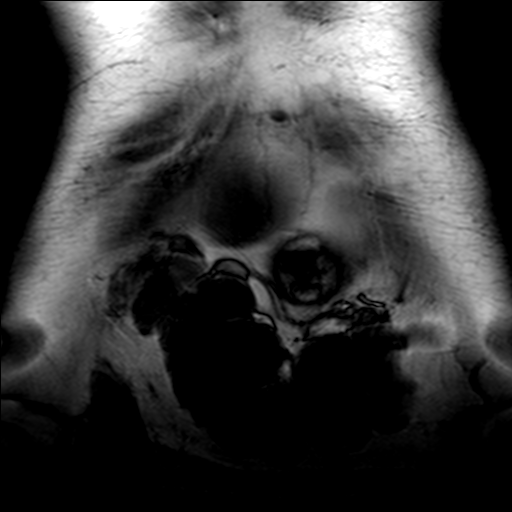
[im 4/16]
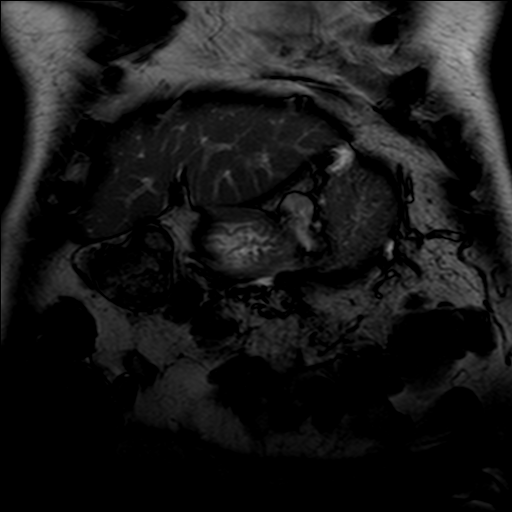
[im 8/16]
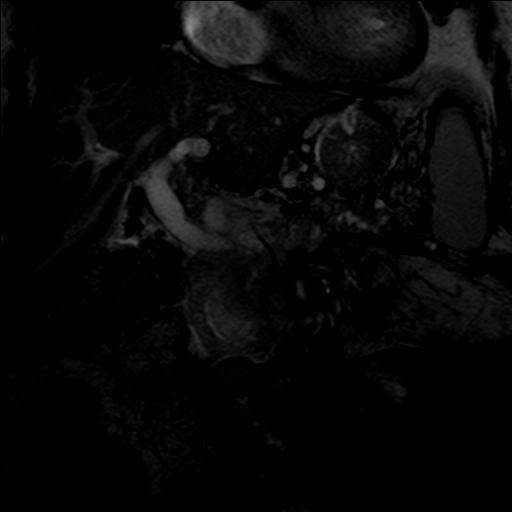
[im 12/16]
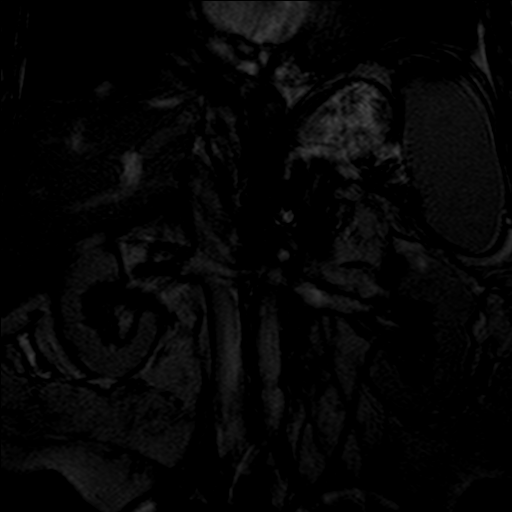
[im 16/16]
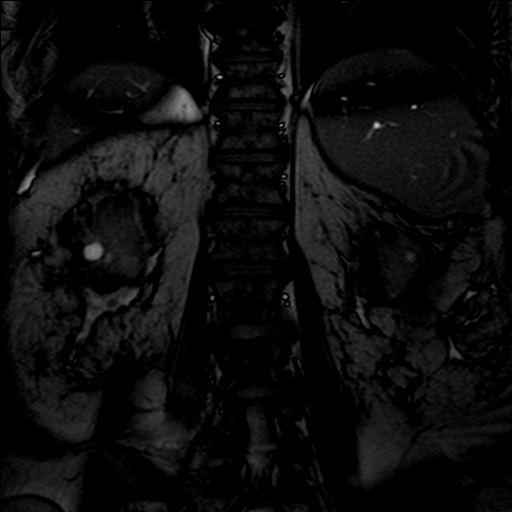

[Series 3: t2_trufi_(person_name)_(person_name) · axial · 8.0mm · 0.68mm/px · z∈[-179,+71]mm · 9 of 27 slices shown]
[im 1/27]
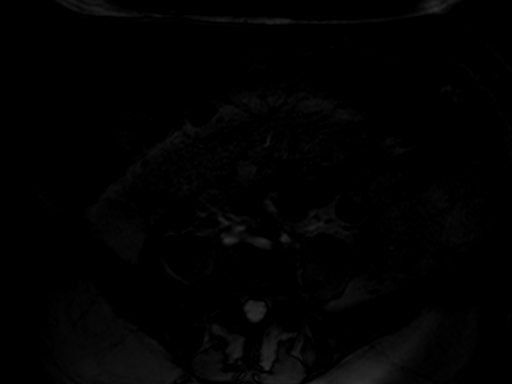
[im 4/27]
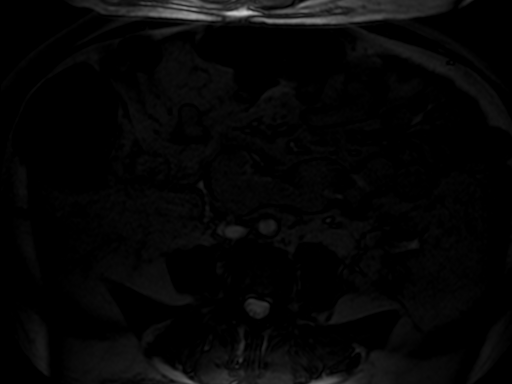
[im 7/27]
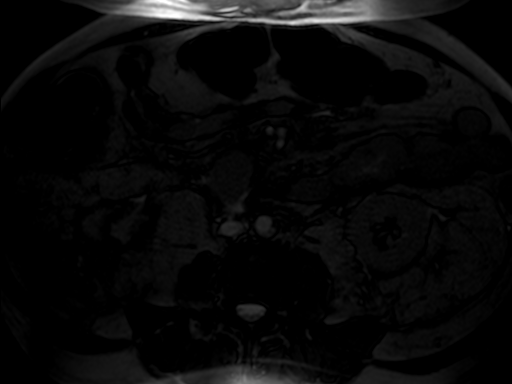
[im 10/27]
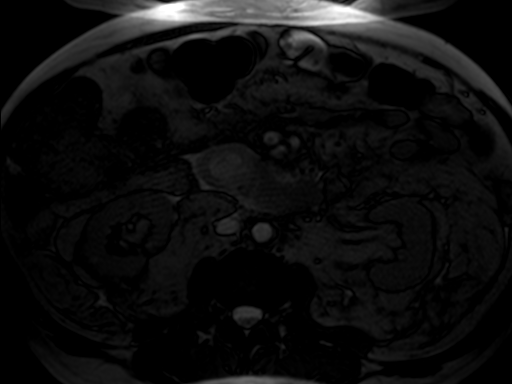
[im 14/27]
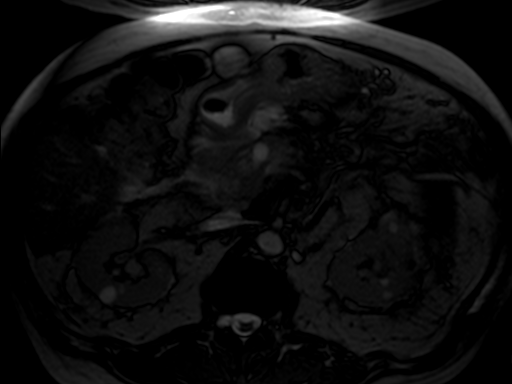
[im 17/27]
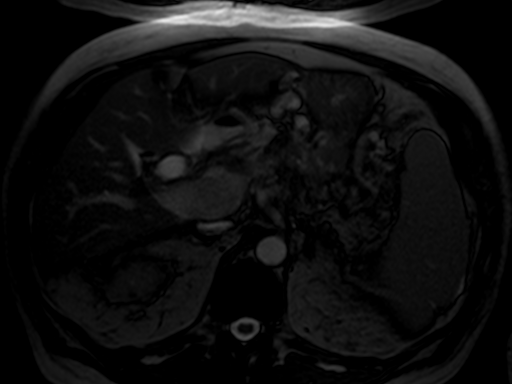
[im 20/27]
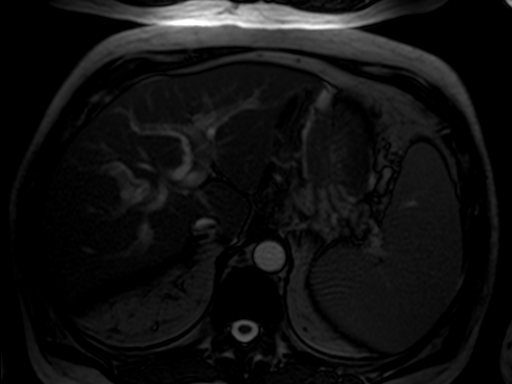
[im 23/27]
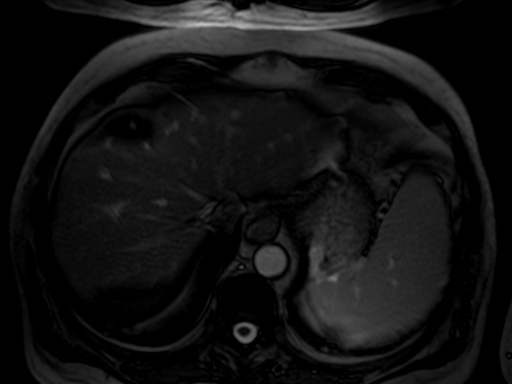
[im 27/27]
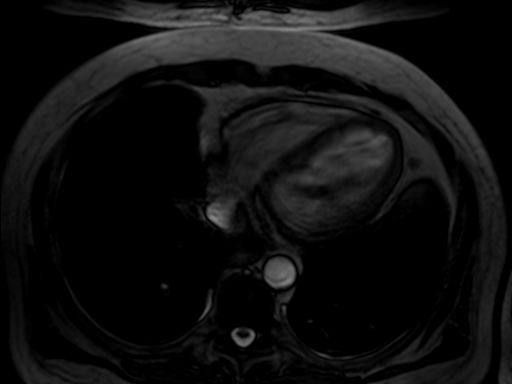

[Series 4: t1_(person_name)2(person_name)_(person_name)_(person_name) · axial · 6.0mm · 0.68mm/px · z∈[-90,+58]mm · 5 of 20 slices shown]
[im 1/20]
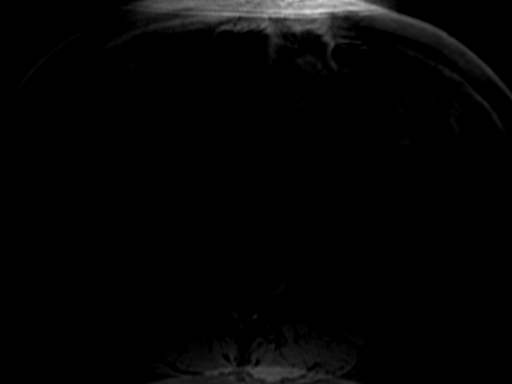
[im 4/20]
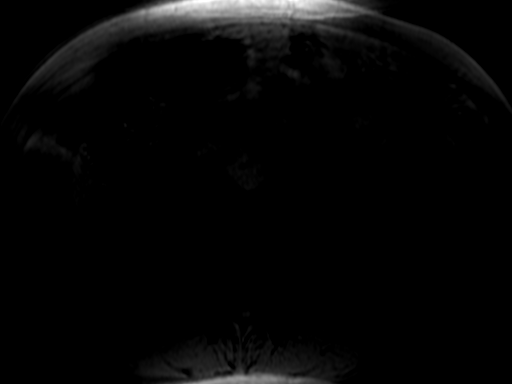
[im 8/20]
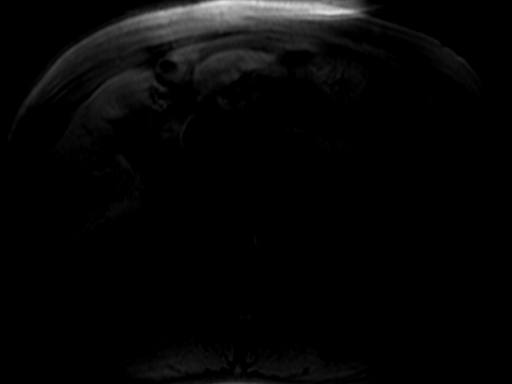
[im 12/20]
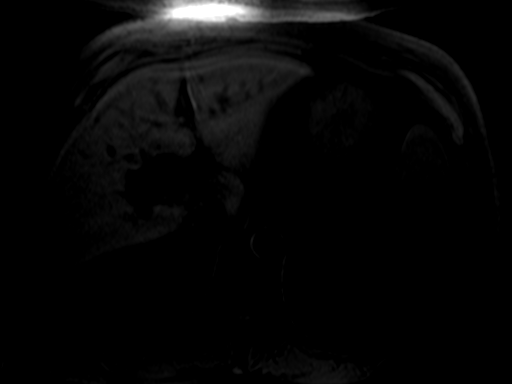
[im 20/20]
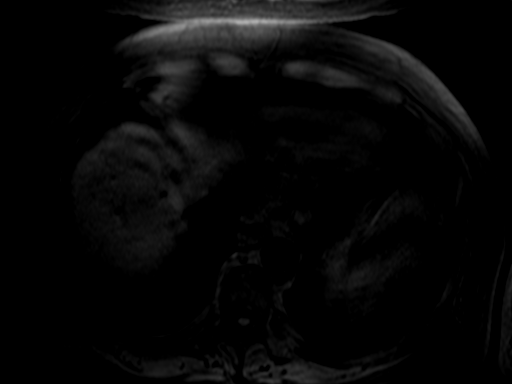

[Series 11: t2_haste_cor_thin_sl_bh · coronal · 2.0mm · 0.59mm/px · 3 of 30 slices shown]
[im 4/30]
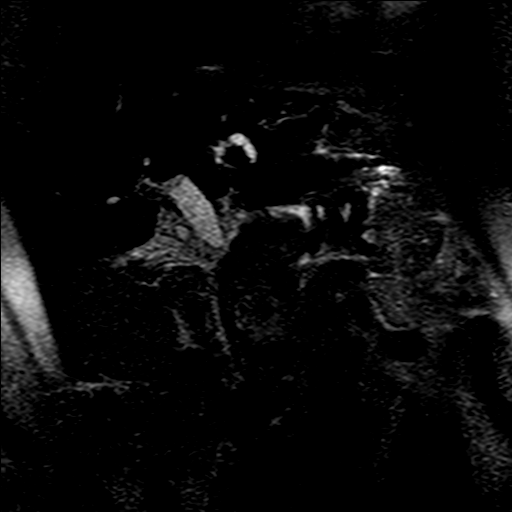
[im 15/30]
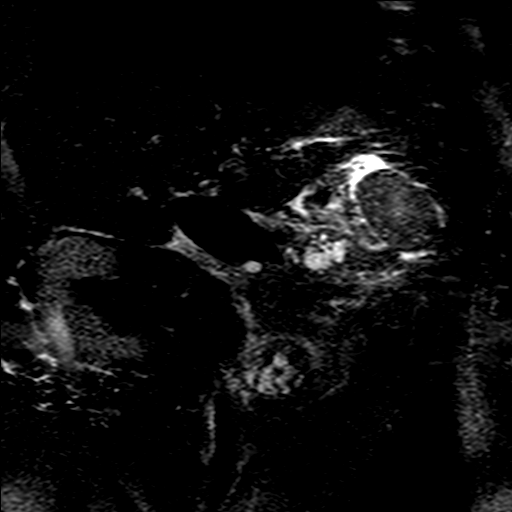
[im 26/30]
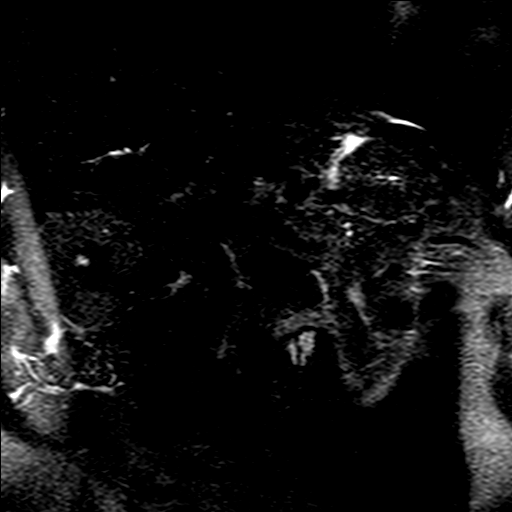

[22 of 48 positions shown; findings below may reference images not displayed]

MRI REPORT

DIAGNOSTIC STUDIES

EXAM
MRI abdomen without contrast, 3D MRCP:

INDICATION
pancreatitis, elevated LFTs. PANCREATITIS. PT'S [REDACTED] OF PANCREATITIS RECENTLY. INCREASED LFT'
S.PAIN. NAUSEA. AMYLASE 467, AST 724, ALT 332, ALK PHOS 179, LIPASE 6212 ANDALL STILL RISING. STENT
IN DUCT 7336 FOR PANCREATITIS. HX ZARD ART(..)

COMPARISONS
Ultrasound 02/12/2017. CT 02/11/2017 and 09/28/2016. MRI 12/04/2016.

FINDINGS
[The study is limited secondary to the absence of intravenous contrast.
The lung bases are clear. The liver is normal in size. Interval development of a moderate degree
of intrahepatic biliary ductal dilatation. No discrete hepatic mass on these noncontrast images.
There is mild splenomegaly, 14 centimeters in AP dimension. The spleen was normal in size on the 05/
24/4573 CT. Punctate calcifications are seen throughout the pancreatic body and tail on the prior
CTs. There is mild dilatation of the main pancreatic duct. There is a 1.6 x 2.6 centimeter
pseudocyst within the proximal pancreatic body, which is mildly increased in size compared to the
prior MRI, [DATE] x 2.5 centimeters. The common hepatic duct measures up to 17 millimeters versus 15
millimeters on the prior MRI. The common bile duct measures up to 14 millimeters versus 12
millimeters on the prior MRI. A biliary stent is not seen. There is mild pancreatic edema with mild
inflammatory stranding along the celiac axis better seen on the recent CT. Minimal fluid within the
right anterior para renal space. There is at least mild gastric mucosal thickening. No abdominal
aortic aneurysm. No hydronephrosis. Small bilateral renal cysts. No pathologically enlarged lymph
nodes. Status post cholecystectomy. No abnormal skeletal signal.

IMPRESSION

1. There is mild pancreatic edema and minimal peripancreatic fluid. Small pseudocyst within the
proximal pancreatic body, mildly increased in size compared to the prior MRI. Findings are
consistent with acute on chronic pancreatitis.
2. Interval development of a moderate degree of biliary ductal dilatation.   No definite biliary
stent. Findings are probably secondary to an underlying stricture. A neoplasm is less likely. Follow
-up with a gastroenterology consultation is recommended.
3. Status post cholecystectomy.

## 2021-05-22 ENCOUNTER — Encounter: Admit: 2021-05-22 | Discharge: 2021-05-22 | Payer: MEDICARE

## 2024-05-07 ENCOUNTER — Encounter: Admit: 2024-05-07 | Discharge: 2024-05-07 | Payer: MEDICARE
